# Patient Record
Sex: Male | Born: 1970 | Race: Asian | Hispanic: Yes | Marital: Single | Smoking: Current some day smoker
Health system: Southern US, Community
[De-identification: ages and names within clinical notes are randomized; demographics above are authoritative.]

## PROBLEM LIST (undated history)

## (undated) ENCOUNTER — Emergency Department (HOSPITAL_COMMUNITY): Admission: EM | Payer: Self-pay | Source: Home / Self Care

## (undated) DIAGNOSIS — Z8616 Personal history of COVID-19: Secondary | ICD-10-CM

## (undated) DIAGNOSIS — Z8709 Personal history of other diseases of the respiratory system: Secondary | ICD-10-CM

## (undated) DIAGNOSIS — Z8619 Personal history of other infectious and parasitic diseases: Secondary | ICD-10-CM

## (undated) DIAGNOSIS — Z9889 Other specified postprocedural states: Secondary | ICD-10-CM

## (undated) HISTORY — DX: Other specified postprocedural states: Z98.890

## (undated) HISTORY — DX: Personal history of COVID-19: Z86.16

## (undated) HISTORY — PX: OTHER SURGICAL HISTORY: SHX169

## (undated) HISTORY — DX: Personal history of other diseases of the respiratory system: Z87.09

---
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
## 2009-11-20 ENCOUNTER — Emergency Department (HOSPITAL_COMMUNITY): Admission: EM | Admit: 2009-11-20 | Discharge: 2009-11-20 | Payer: Self-pay | Admitting: Emergency Medicine

## 2019-05-24 ENCOUNTER — Ambulatory Visit (HOSPITAL_COMMUNITY)
Admission: EM | Admit: 2019-05-24 | Discharge: 2019-05-24 | Disposition: A | Discharge status: Home / Self Care | Payer: HRSA Program | Attending: Family Medicine | Admitting: Family Medicine

## 2019-05-24 ENCOUNTER — Encounter (HOSPITAL_COMMUNITY): Payer: Self-pay | Admitting: *Deleted

## 2019-05-24 ENCOUNTER — Other Ambulatory Visit: Payer: Self-pay

## 2019-05-24 DIAGNOSIS — U071 COVID-19: Secondary | ICD-10-CM | POA: Diagnosis not present

## 2019-05-24 DIAGNOSIS — F17210 Nicotine dependence, cigarettes, uncomplicated: Secondary | ICD-10-CM | POA: Insufficient documentation

## 2019-05-24 DIAGNOSIS — Z79899 Other long term (current) drug therapy: Secondary | ICD-10-CM | POA: Diagnosis not present

## 2019-05-24 DIAGNOSIS — R509 Fever, unspecified: Secondary | ICD-10-CM | POA: Diagnosis present

## 2019-05-24 NOTE — ED Provider Notes (Signed)
[facility]    CSN: [id number] Arrival date & time: 05/24/19  1313      History   Chief Complaint Chief Complaint  Patient presents with  . Fever    HPI Michael Clay is a 48 y.o. male.   HPI  Patient presents today with a concern for fever. He reports that he is unable to measure his temperature at home as he does not have a thermometer.  Approximately 4 days ago upon returning home from work he felt feverish and has not returned to work since that day due to concern for possible fever.  He denies symptoms of diarrhea, abdominal pain, shortness of breath, headache or cough.  He reports since 2 days ago he has felt completely fine however with presented today to confirm that he was no longer experiencing any fever.  He has taken a couple doses of Tylenol but wanted to know if there was any other medications that he could take to treat symptoms of fever.  He is afebrile on arrival today. History reviewed. No pertinent past medical history.  There are no active problems to display for this patient.   History reviewed. No pertinent surgical history.     Home Medications    Prior to Admission medications   Medication Sig Start Date End Date Taking? Authorizing Provider  Acetaminophen (TYLENOL PO) Take by mouth.   Yes [provider]    Family History Family History  Problem Relation Age of Onset  . Healthy Mother   . Healthy Father     Social History Social History   Tobacco Use  . Smoking status: Current Some Day Smoker    Types: Cigarettes  . Smokeless tobacco: Never Used  Substance Use Topics  . Alcohol use: Yes    Comment: occasionally  . Drug use: Never     Allergies   Patient has no known allergies.   Review of Systems Review of Systems Pertinent negatives listed in HPI Physical Exam Triage Vital Signs ED Triage Vitals  Enc Vitals Group     BP 05/24/19 1439 115/77     Pulse Rate 05/24/19 1439 82     Resp 05/24/19 1439 18    Temp 05/24/19 1440 97.9 F (36.6 C)     Temp src --      SpO2 05/24/19 1439 95 %     Weight --      Height --      Head Circumference --      Peak Flow --      Pain Score 05/24/19 1441 0     Pain Loc --      Pain Edu? --      Excl. in GC? --    No data found.  Updated Vital Signs BP 115/77   Pulse 82   Temp 97.9 F (36.6 C) Comment: Had Tylenol @ 1300  Resp 18   SpO2 95%   Visual Acuity Right Eye Distance:   Left Eye Distance:   Bilateral Distance:    Right Eye Near:   Left Eye Near:    Bilateral Near:     Physical Exam General appearance: alert, well developed, well nourished, cooperative and in no distress Head: Normocephalic, without obvious abnormality, atraumatic Respiratory: Respirations even and unlabored, normal respiratory rate Heart: rate and rhythm normal. No gallop or murmurs noted on exam  Abdomen: BS +, no distention, no rebound tenderness, or no mass Extremities: No gross deformities Skin: Skin color, texture, turgor normal. No  rashes seen  Psych: Appropriate mood and affect. Neurologic: Mental status: Alert, oriented to person, place, and time, thought content appropriate.  UC Treatments / Results  Labs (all labs ordered are listed, but only abnormal results are displayed) Labs Reviewed  NOVEL CORONAVIRUS, NAA (HOSPITAL ORDER, SEND-OUT TO REF LAB)    EKG  Radiology No results found.  Procedures Procedures (including critical care time)  Medications Ordered in UC Medications - No data to display  Initial Impression / Assessment and Plan / UC Course  I have reviewed the triage vital signs and the nursing notes.  Pertinent labs & imaging results that were available during my care of the patient were reviewed by me and considered in my medical decision making (see chart for details).   Mr. Michael Clay presents today afebrile with concern of fever.  Reassurance provided. Information provided in Spanish regarding signs and symptoms associated  with complicated fever and indication for taking Tylenol.  Patient was appreciative and verbalized understanding and was discharged.  COVID test was collected during today's encounter and is pending. Final Clinical Impressions(s) / UC Diagnoses   Final diagnoses:  Fever, unspecified   Discharge Instructions   None    ED Prescriptions    None     Controlled Substance Prescriptions [place] Controlled Substance Registry consulted? Not Applicable   Scot Jun, [place] 05/24/19 1539

## 2019-05-24 NOTE — ED Triage Notes (Signed)
Pt states he had fever starting approx 5 days ago, but works outside doing roofing.  Denies any other sxs; states fevers have been improving.

## 2019-05-28 LAB — NOVEL CORONAVIRUS, NAA (HOSP ORDER, SEND-OUT TO REF LAB; TAT 18-24 HRS): SARS-CoV-2, NAA: DETECTED — AB

## 2019-05-29 ENCOUNTER — Telehealth (HOSPITAL_COMMUNITY): Payer: Self-pay | Admitting: Emergency Medicine

## 2019-05-29 NOTE — Telephone Encounter (Signed)
Your test for COVID-19 was positive, meaning that you were infected with the novel coronavirus and could give the germ to others.  Please continue isolation at home, for at least 10 days since the start of your fever/cough/breathlessness and until you have had 3 consecutive days without fever (without taking a fever reducer) and with cough/breathlessness improving. Please continue good preventive care measures, including:  frequent hand-washing, avoid touching your face, cover coughs/sneezes, stay out of crowds and keep a 6 foot distance from others.  Recheck or go to the nearest hospital ED tent for re-assessment if fever/cough/breathlessness return.  Patient contacted and made aware of    results, all questions answered   

## 2019-06-04 ENCOUNTER — Telehealth (HOSPITAL_COMMUNITY): Payer: Self-pay | Admitting: Emergency Medicine

## 2019-06-04 MED ORDER — BENZONATATE 100 MG PO CAPS: 100.0000 mg | ORAL_CAPSULE | Freq: Three times a day (TID) | ORAL | 0 refills | Status: AC

## 2019-06-04 NOTE — Telephone Encounter (Signed)
Pt dx with covid 11 days ago, pt called stating he has a lingering cough that wont go away, okay to sent [place] per Lanelle Bal NP, will send to pt preferred pharmacy.

## 2019-06-05 ENCOUNTER — Inpatient Hospital Stay (HOSPITAL_COMMUNITY)
Admission: EM | Admit: 2019-06-05 | Discharge: 2019-06-17 | DRG: 207 | Disposition: A | Discharge status: Home / Self Care | Payer: HRSA Program | Attending: Internal Medicine | Admitting: Internal Medicine

## 2019-06-05 ENCOUNTER — Encounter (HOSPITAL_COMMUNITY): Payer: Self-pay | Admitting: Emergency Medicine

## 2019-06-05 ENCOUNTER — Telehealth (HOSPITAL_COMMUNITY): Payer: Self-pay | Admitting: Emergency Medicine

## 2019-06-05 ENCOUNTER — Other Ambulatory Visit: Payer: Self-pay

## 2019-06-05 ENCOUNTER — Emergency Department (HOSPITAL_COMMUNITY): Payer: HRSA Program

## 2019-06-05 DIAGNOSIS — K5900 Constipation, unspecified: Secondary | ICD-10-CM | POA: Diagnosis present

## 2019-06-05 DIAGNOSIS — J1289 Other viral pneumonia: Secondary | ICD-10-CM | POA: Diagnosis present

## 2019-06-05 DIAGNOSIS — J939 Pneumothorax, unspecified: Secondary | ICD-10-CM | POA: Diagnosis not present

## 2019-06-05 DIAGNOSIS — J189 Pneumonia, unspecified organism: Secondary | ICD-10-CM | POA: Diagnosis not present

## 2019-06-05 DIAGNOSIS — J9311 Primary spontaneous pneumothorax: Secondary | ICD-10-CM | POA: Diagnosis not present

## 2019-06-05 DIAGNOSIS — K7040 Alcoholic hepatic failure without coma: Secondary | ICD-10-CM | POA: Diagnosis present

## 2019-06-05 DIAGNOSIS — Z4682 Encounter for fitting and adjustment of non-vascular catheter: Secondary | ICD-10-CM | POA: Diagnosis not present

## 2019-06-05 DIAGNOSIS — J9601 Acute respiratory failure with hypoxia: Secondary | ICD-10-CM | POA: Diagnosis present

## 2019-06-05 DIAGNOSIS — J181 Lobar pneumonia, unspecified organism: Secondary | ICD-10-CM | POA: Diagnosis not present

## 2019-06-05 DIAGNOSIS — F17210 Nicotine dependence, cigarettes, uncomplicated: Secondary | ICD-10-CM | POA: Diagnosis present

## 2019-06-05 DIAGNOSIS — J930 Spontaneous tension pneumothorax: Secondary | ICD-10-CM | POA: Diagnosis not present

## 2019-06-05 DIAGNOSIS — Z9689 Presence of other specified functional implants: Secondary | ICD-10-CM | POA: Diagnosis not present

## 2019-06-05 DIAGNOSIS — U071 COVID-19: Secondary | ICD-10-CM | POA: Diagnosis present

## 2019-06-05 DIAGNOSIS — J1282 Pneumonia due to coronavirus disease 2019: Secondary | ICD-10-CM | POA: Diagnosis present

## 2019-06-05 DIAGNOSIS — R579 Shock, unspecified: Secondary | ICD-10-CM | POA: Diagnosis not present

## 2019-06-05 DIAGNOSIS — J988 Other specified respiratory disorders: Secondary | ICD-10-CM | POA: Diagnosis not present

## 2019-06-05 DIAGNOSIS — Z01818 Encounter for other preprocedural examination: Secondary | ICD-10-CM

## 2019-06-05 DIAGNOSIS — R0902 Hypoxemia: Secondary | ICD-10-CM

## 2019-06-05 DIAGNOSIS — Z9289 Personal history of other medical treatment: Secondary | ICD-10-CM | POA: Diagnosis not present

## 2019-06-05 DIAGNOSIS — J80 Acute respiratory distress syndrome: Secondary | ICD-10-CM | POA: Diagnosis present

## 2019-06-05 DIAGNOSIS — E875 Hyperkalemia: Secondary | ICD-10-CM | POA: Diagnosis present

## 2019-06-05 DIAGNOSIS — E876 Hypokalemia: Secondary | ICD-10-CM | POA: Diagnosis not present

## 2019-06-05 DIAGNOSIS — L8990 Pressure ulcer of unspecified site, unspecified stage: Secondary | ICD-10-CM | POA: Insufficient documentation

## 2019-06-05 DIAGNOSIS — F1010 Alcohol abuse, uncomplicated: Secondary | ICD-10-CM | POA: Diagnosis present

## 2019-06-05 DIAGNOSIS — J9690 Respiratory failure, unspecified, unspecified whether with hypoxia or hypercapnia: Secondary | ICD-10-CM

## 2019-06-05 DIAGNOSIS — R739 Hyperglycemia, unspecified: Secondary | ICD-10-CM | POA: Diagnosis present

## 2019-06-05 DIAGNOSIS — R0602 Shortness of breath: Secondary | ICD-10-CM

## 2019-06-05 DIAGNOSIS — T859XXA Unspecified complication of internal prosthetic device, implant and graft, initial encounter: Secondary | ICD-10-CM

## 2019-06-05 DIAGNOSIS — J9600 Acute respiratory failure, unspecified whether with hypoxia or hypercapnia: Secondary | ICD-10-CM

## 2019-06-05 DIAGNOSIS — Z452 Encounter for adjustment and management of vascular access device: Secondary | ICD-10-CM

## 2019-06-05 LAB — COMPREHENSIVE METABOLIC PANEL
ALT: 50 U/L — ABNORMAL HIGH (ref 0–44)
AST: 85 U/L — ABNORMAL HIGH (ref 15–41)
Albumin: 1.9 g/dL — ABNORMAL LOW (ref 3.5–5.0)
Alkaline Phosphatase: 114 U/L (ref 38–126)
Anion gap: 11 (ref 5–15)
BUN: 11 mg/dL (ref 6–20)
CO2: 18 mmol/L — ABNORMAL LOW (ref 22–32)
Calcium: 7.4 mg/dL — ABNORMAL LOW (ref 8.9–10.3)
Chloride: 102 mmol/L (ref 98–111)
Creatinine, Ser: 0.84 mg/dL (ref 0.61–1.24)
GFR calc Af Amer: >60 mL/min (ref >60)
GFR calc non Af Amer: >60 mL/min (ref >60)
Glucose, Bld: 104 mg/dL — ABNORMAL HIGH (ref 70–99)
Potassium: 4.8 mmol/L (ref 3.5–5.1)
Sodium: 131 mmol/L — ABNORMAL LOW (ref 135–145)
Total Bilirubin: 1.1 mg/dL (ref 0.3–1.2)
Total Protein: 5.8 g/dL — ABNORMAL LOW (ref 6.5–8.1)

## 2019-06-05 LAB — URINALYSIS, ROUTINE W REFLEX MICROSCOPIC
Bilirubin Urine: NEGATIVE
Glucose, UA: NEGATIVE mg/dL
Hgb urine dipstick: NEGATIVE
Ketones, ur: 5 mg/dL — AB
Leukocytes,Ua: NEGATIVE
Nitrite: NEGATIVE
Protein, ur: NEGATIVE mg/dL
Specific Gravity, Urine: 1.011 (ref 1.005–1.030)
pH: 6 (ref 5.0–8.0)

## 2019-06-05 LAB — LACTIC ACID, PLASMA
Lactic Acid, Venous: 1.6 mmol/L (ref 0.5–1.9)
Lactic Acid, Venous: 1.5 mmol/L (ref 0.5–1.9)

## 2019-06-05 LAB — PROTIME-INR
INR: 1.3 — ABNORMAL HIGH (ref 0.8–1.2)
Prothrombin Time: 16.3 seconds — ABNORMAL HIGH (ref 11.4–15.2)

## 2019-06-05 LAB — CBC WITH DIFFERENTIAL/PLATELET
Abs Immature Granulocytes: 0.38 10*3/uL — ABNORMAL HIGH (ref 0.00–0.07)
Basophils Absolute: 0.1 10*3/uL (ref 0.0–0.1)
Basophils Relative: 1 %
Eosinophils Absolute: 0.1 10*3/uL (ref 0.0–0.5)
Eosinophils Relative: 1 %
HCT: 44.9 % (ref 39.0–52.0)
Hemoglobin: 15 g/dL (ref 13.0–17.0)
Immature Granulocytes: 3 %
Lymphocytes Relative: 8 %
Lymphs Abs: 1 10*3/uL (ref 0.7–4.0)
MCH: 33 pg (ref 26.0–34.0)
MCHC: 33.4 g/dL (ref 30.0–36.0)
MCV: 98.9 fL (ref 80.0–100.0)
Monocytes Absolute: 1 10*3/uL (ref 0.1–1.0)
Monocytes Relative: 8 %
Neutro Abs: 9.9 10*3/uL — ABNORMAL HIGH (ref 1.7–7.7)
Neutrophils Relative %: 79 %
Platelets: 248 10*3/uL (ref 150–400)
RBC: 4.54 MIL/uL (ref 4.22–5.81)
RDW: 13.2 % (ref 11.5–15.5)
WBC: 12.4 10*3/uL — ABNORMAL HIGH (ref 4.0–10.5)
nRBC: 0.6 % — ABNORMAL HIGH (ref 0.0–0.2)

## 2019-06-05 LAB — POCT I-STAT 7, (LYTES, BLD GAS, ICA,H+H)
Acid-base deficit: 3 mmol/L — ABNORMAL HIGH (ref 0.0–2.0)
Bicarbonate: 20.5 mmol/L (ref 20.0–28.0)
Calcium, Ion: 1.13 mmol/L — ABNORMAL LOW (ref 1.15–1.40)
HCT: 43 % (ref 39.0–52.0)
Hemoglobin: 14.6 g/dL (ref 13.0–17.0)
O2 Saturation: 89 %
Patient temperature: 99.5
Potassium: 4.8 mmol/L (ref 3.5–5.1)
Sodium: 134 mmol/L — ABNORMAL LOW (ref 135–145)
TCO2: 21 mmol/L — ABNORMAL LOW (ref 22–32)
pCO2 arterial: 31.6 mmHg — ABNORMAL LOW (ref 32.0–48.0)
pH, Arterial: 7.422 (ref 7.350–7.450)
pO2, Arterial: 57 mmHg — ABNORMAL LOW (ref 83.0–108.0)

## 2019-06-05 LAB — TROPONIN I (HIGH SENSITIVITY)
Troponin I (High Sensitivity): 108 ng/L (ref <18)
Troponin I (High Sensitivity): 110 ng/L (ref <18)

## 2019-06-05 LAB — POCT I-STAT EG7
Acid-base deficit: 2 mmol/L (ref 0.0–2.0)
Bicarbonate: 21 mmol/L (ref 20.0–28.0)
Calcium, Ion: 1.02 mmol/L — ABNORMAL LOW (ref 1.15–1.40)
HCT: 45 % (ref 39.0–52.0)
Hemoglobin: 15.3 g/dL (ref 13.0–17.0)
O2 Saturation: 88 %
Potassium: 4.6 mmol/L (ref 3.5–5.1)
Sodium: 134 mmol/L — ABNORMAL LOW (ref 135–145)
TCO2: 22 mmol/L (ref 22–32)
pCO2, Ven: 29.4 mmHg — ABNORMAL LOW (ref 44.0–60.0)
pH, Ven: 7.461 — ABNORMAL HIGH (ref 7.250–7.430)
pO2, Ven: 51 mmHg — ABNORMAL HIGH (ref 32.0–45.0)

## 2019-06-05 LAB — FIBRINOGEN: Fibrinogen: 517 mg/dL — ABNORMAL HIGH (ref 210–475)

## 2019-06-05 LAB — LACTATE DEHYDROGENASE: LDH: 390 U/L — ABNORMAL HIGH (ref 98–192)

## 2019-06-05 LAB — C-REACTIVE PROTEIN: CRP: 16.1 mg/dL — ABNORMAL HIGH (ref <1.0)

## 2019-06-05 LAB — FERRITIN: Ferritin: 292 ng/mL (ref 24–336)

## 2019-06-05 LAB — APTT: aPTT: 30 seconds (ref 24–36)

## 2019-06-05 LAB — BRAIN NATRIURETIC PEPTIDE: B Natriuretic Peptide: 380.9 pg/mL — ABNORMAL HIGH (ref 0.0–100.0)

## 2019-06-05 LAB — D-DIMER, QUANTITATIVE: D-Dimer, Quant: 4.42 ug/mL-FEU — ABNORMAL HIGH (ref 0.00–0.50)

## 2019-06-05 MED ORDER — SODIUM CHLORIDE 0.9% FLUSH: 3.0000 mL | INTRAVENOUS | Status: DC | PRN

## 2019-06-05 MED ORDER — ACETAMINOPHEN 325 MG PO TABS
650.0000 mg | ORAL_TABLET | Freq: Four times a day (QID) | ORAL | Status: DC | PRN
  Administered 2019-06-11 – 2019-06-12 (×3): 650 mg via ORAL
  Filled 2019-06-05 (×2): qty 2

## 2019-06-05 MED ORDER — OXYCODONE HCL 5 MG PO TABS
5.0000 mg | ORAL_TABLET | ORAL | Status: DC | PRN
  Administered 2019-06-08 (×2): 5 mg via ORAL
  Filled 2019-06-05 (×2): qty 1

## 2019-06-05 MED ORDER — BISACODYL 5 MG PO TBEC
5.0000 mg | DELAYED_RELEASE_TABLET | Freq: Every day | ORAL | Status: DC | PRN
  Administered 2019-06-08: 11:00:00 5 mg via ORAL
  Filled 2019-06-05: qty 1

## 2019-06-05 MED ORDER — DEXAMETHASONE SODIUM PHOSPHATE 10 MG/ML IJ SOLN
10.0000 mg | Freq: Once | INTRAMUSCULAR | Status: AC
  Administered 2019-06-07: 10 mg via INTRAVENOUS

## 2019-06-05 MED ORDER — SODIUM CHLORIDE 0.9 % IV SOLN
1.0000 g | INTRAVENOUS | Status: AC
  Administered 2019-06-06 – 2019-06-11 (×6): 1 g via INTRAVENOUS
  Filled 2019-06-05 (×2): qty 1
  Filled 2019-06-05 (×2): qty 10
  Filled 2019-06-05 (×2): qty 1

## 2019-06-05 MED ORDER — SODIUM CHLORIDE 0.9 % IV SOLN
500.0000 mg | Freq: Once | INTRAVENOUS | Status: AC
  Administered 2019-06-05: 15:00:00 500 mg via INTRAVENOUS
  Filled 2019-06-05: qty 500

## 2019-06-05 MED ORDER — SODIUM CHLORIDE 0.9 % IV SOLN
250.0000 mL | INTRAVENOUS | Status: DC | PRN
  Administered 2019-06-06 – 2019-06-07 (×3): 250 mL via INTRAVENOUS

## 2019-06-05 MED ORDER — POLYETHYLENE GLYCOL 3350 17 G PO PACK
17.0000 g | PACK | Freq: Every day | ORAL | Status: DC | PRN
  Administered 2019-06-09: 17 g via ORAL
  Filled 2019-06-05: qty 1

## 2019-06-05 MED ORDER — SODIUM CHLORIDE 0.9 % IV SOLN
100.0000 mg | INTRAVENOUS | Status: AC
  Administered 2019-06-06 – 2019-06-09 (×4): 100 mg via INTRAVENOUS
  Filled 2019-06-05 (×4): qty 20

## 2019-06-05 MED ORDER — ONDANSETRON HCL 4 MG/2ML IJ SOLN: 4.0000 mg | Freq: Four times a day (QID) | INTRAMUSCULAR | Status: DC | PRN

## 2019-06-05 MED ORDER — SODIUM CHLORIDE 0.9% FLUSH
3.0000 mL | Freq: Two times a day (BID) | INTRAVENOUS | Status: DC
  Administered 2019-06-05 – 2019-06-14 (×17): 3 mL via INTRAVENOUS

## 2019-06-05 MED ORDER — ONDANSETRON HCL 4 MG PO TABS: 4.0000 mg | ORAL_TABLET | Freq: Four times a day (QID) | ORAL | Status: DC | PRN

## 2019-06-05 MED ORDER — SODIUM CHLORIDE 0.9% FLUSH
3.0000 mL | Freq: Two times a day (BID) | INTRAVENOUS | Status: DC
  Administered 2019-06-05 – 2019-06-14 (×17): 3 mL via INTRAVENOUS

## 2019-06-05 MED ORDER — SODIUM CHLORIDE 0.9 % IV SOLN
1.0000 g | Freq: Once | INTRAVENOUS | Status: AC
  Administered 2019-06-05: 1 g via INTRAVENOUS
  Filled 2019-06-05: qty 10

## 2019-06-05 MED ORDER — ENOXAPARIN SODIUM 40 MG/0.4ML ~~LOC~~ SOLN: 40.0000 mg | SUBCUTANEOUS | Status: DC

## 2019-06-05 MED ORDER — SODIUM CHLORIDE 0.9 % IV SOLN
200.0000 mg | Freq: Once | INTRAVENOUS | Status: AC
  Administered 2019-06-05 (×2): 200 mg via INTRAVENOUS
  Filled 2019-06-05: qty 40

## 2019-06-05 MED ORDER — DOCUSATE SODIUM 100 MG PO CAPS
100.0000 mg | ORAL_CAPSULE | Freq: Two times a day (BID) | ORAL | Status: DC
  Administered 2019-06-08: 11:00:00 100 mg via ORAL
  Filled 2019-06-05 (×2): qty 1

## 2019-06-05 MED ORDER — ENOXAPARIN SODIUM 40 MG/0.4ML ~~LOC~~ SOLN
40.0000 mg | SUBCUTANEOUS | Status: DC
  Administered 2019-06-05: 22:00:00 40 mg via SUBCUTANEOUS
  Filled 2019-06-05 (×2): qty 0.4

## 2019-06-05 MED ORDER — SODIUM CHLORIDE 0.9 % IV SOLN
500.0000 mg | INTRAVENOUS | Status: AC
  Administered 2019-06-06 – 2019-06-09 (×4): 500 mg via INTRAVENOUS
  Filled 2019-06-05 (×4): qty 500

## 2019-06-05 NOTE — ED Notes (Signed)
Floor unable to take report at this time; # provided.

## 2019-06-05 NOTE — ED Notes (Signed)
Positioned pt in prone; noted Sp02 improved to 94%

## 2019-06-05 NOTE — ED Notes (Signed)
Pt placed on 15 L [place] as well as 15 L nonrebreather.

## 2019-06-05 NOTE — ED Triage Notes (Addendum)
Pt tested positive for Covid Monday, called ems today for SOB, when EMS arrived pt was walking around the house and satting 66% on RA, pt came up to 91% on 15 L non-rebreather. Pt denies CP.

## 2019-06-05 NOTE — ED Notes (Signed)
Pt wife Verdis Frederickson [phone]

## 2019-06-05 NOTE — H&P (Signed)
History and Physical    Raynelle BringLeonel Ebel WJX:[id number] DOB: 10/04/1971 DOA: 06/05/2019  PCP: Patient, No Pcp Per Consultants:  None Patient coming from:  Home - lives with his partner and a roommate; NOK: [employer]  Chief Complaint: SOB  HPI: Raynelle BringLeonel Pursifull is a 48 y.o. male with no significant medical history presenting with worsening SOB in the setting of COVID.  He has not received care through Epic prior to an initial Urgent Care visit on 7/25.  At that time, he reported subjective fever starting 5 days prior (about 7/20).  He was afebrile at that visit and was tested for COVID.   He was notified on 7/30 that his COVID test was positive.  He called back yesterday with c/o persistent cough and was [place].  He called back today and reported SOB with O2 sats 69% at home.  EMS was called and sats were 66% with patient walking around his house, improved to 91% on 15L NRB. Denies significant cough.   ED Course: Diagnosed with COVID 3 days ago.  Worsening SOB x 2 days.  62% with EMS while ambulating. Difficulty getting sats up.  Placed on 15L NRB to low 90s, dropped to 80s.  Plan for high flow [place] [facility]  Placed on [place] O2 and also NRB and now finally with enough O2, improved WOB.  Troponin 100, CTA ordered to determine heparin or not.  May need PCCM consult.  Review of Systems: As per HPI; otherwise review of systems reviewed and negative.  Very limited by SOB/NRB as well as language barrier (despite tele-interpreter).    Ambulatory Status:  Ambulates without assistance  History reviewed. No pertinent past medical history.  History reviewed. No pertinent surgical history.  Social History   Socioeconomic History  . Marital status: Single    Spouse name: Not on file  . Number of children: Not on file  . Years of education: Not on file  . Highest education level: Not on file  Occupational History  . Not on file  Social Needs  . Financial resource strain: Not on file   . Food insecurity    Worry: Not on file    Inability: Not on file  . Transportation needs    Medical: Not on file    Non-medical: Not on file  Tobacco Use  . Smoking status: Current Some Day Smoker    Types: Cigarettes  . Smokeless tobacco: Never Used  Substance and Sexual Activity  . Alcohol use: Yes    Comment: drinks beer regularly  . Drug use: Never  . Sexual activity: Not on file  Lifestyle  . Physical activity    Days per week: Not on file    Minutes per session: Not on file  . Stress: Not on file  Relationships  . 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 Intimate partner violence    Fear of current or ex partner: Not on file    Emotionally abused: Not on file    Physically abused: Not on file    Forced sexual activity: Not on file  Other Topics Concern  . Not on file  Social History  Narrative  . Not on file    No Known Allergies  Family History  Problem Relation Age of Onset  . Healthy Mother   . Healthy Father     Prior to Admission medications   Medication Sig Start Date End Date Taking? Authorizing Provider  Acetaminophen (TYLENOL PO) Take by mouth.    [provider]  benzonatate (TESSALON) 100 MG capsule Take 1 capsule (100 mg total) by mouth every 8 (eight) hours. 06/04/19   Eustace MooreNelson, Yvonne Sue, MD    Physical Exam: Vitals:   06/05/19 1545 06/05/19 1600 06/05/19 1615 06/05/19 1630  BP: 127/80 123/86 112/79 126/72  Pulse: 98 93 95 97  Resp: (!) 37 (!) 39 (!) 39 (!) 33  Temp:      TempSrc:      SpO2: 91% (!) 88% (!) 89% (!) 87%     . General:  Appears very ill . Eyes:  PERRL, EOMI, normal lids, iris . ENT:  grossly normal hearing, lips & tongue, mildly dry mm . Neck:  no LAD, masses or thyromegaly .  Cardiovascular:  RRR, no m/r/g. No LE edema.  Marland Kitchen. Respiratory:   Diffuse rhonchi.  RR 30-40 consistently with O2 sats 88-90% on NRB with 10L [place] O2 . Abdomen:  soft, NT, ND, NABS . Back:   normal alignment, no CVAT . Skin:  no rash or induration seen on limited exam . Musculoskeletal:  grossly normal tone BUE/BLE, good ROM, no bony abnormality . Psychiatric: blunted mood and affect, speech fluent and appropriate, AOx3 . Neurologic:  CN 2-12 grossly intact, moves all extremities in coordinated fashion    Radiological Exams on Admission: Dg Chest Port 1 View  Result Date: 06/05/2019 CLINICAL DATA:  Shortness of breath. COVID-19. Hypoxia. EXAM: PORTABLE CHEST 1 VIEW COMPARISON:  None. FINDINGS: There are extensive bilateral peripheral hazy pulmonary infiltrates. No effusions. The heart size and pulmonary vascularity are normal. No acute bone abnormality. IMPRESSION: Extensive bilateral pulmonary infiltrates. Electronically Signed   By: Francene BoyersJames  Maxwell M.D.   On: 06/05/2019 13:36    EKG: Independently reviewed.  Sinus tachycardia with rate 104; nonspecific ST changes with no evidence of acute ischemia   Labs on Admission: I have personally reviewed the available labs and imaging studies at the time of the admission.  Pertinent labs:   Na++ 131 CO2 18 Glucose 104 Albumin 1.9 AST 85/ALT 50 HS troponin 108, 110 BNP 380.9 LDH 390 Ferritin 292 CRP 16.1 Fibrinogen 517 Lactate 1.6, 1.5 WBC 12.4 ABG: 7.461/29.4/51.0/88% INR 1.3 COVID POSITIVE UA: 5 ketones Blood cultures pending  Assessment/Plan Active Problems:   Respiratory tract infection due to COVID-19 virus   Acute respiratory failure with hypoxia -Patient with presenting with SOB and marked hypoxia with known COVID - he is critically ill -She does not have a usual home O2 requirement and is currently requiring 10L [place] O2 AND 15L NRB O2 -COVID POSITIVE -The patient has NO known comorbidities which may increase the risk for  ARDS/MODS -However, exam is concerning for development of ARDS/MODS due to respiratory distress -Pertinent labs concerning for COVID include increased LFTs; markedly elevated CRP (>7); elevated troponin; increased LDH; increased fibrinogen -CXR with "extensive bilateral infiltrates" -Will treat with broad-spectrum antibiotics  -[facility] for further evaluation, close monitoring, and treatment -At this time, will attempt to avoid use of aerosolized medications and use HFAs instead -Will check daily labs including BMP with Mag, Phos; LFTs; CBC with differential; CRP (q12h); ferritin; fibrinogen; D-dimer (q12h) -Consider  Echo (particularly if troponin is significantly elevated)  -Will order steroids (10 mg Decadron x 1) and Remdesivir (pharmacy consult) given +COVID test, +CXR, and hypoxia <94% on room air -PCCM consultation -If the patient is hypoxic or on >3L oxygen from baseline or CRP >7, considerTocilizumab 8 mg/kg x1 IF + COVID test; O2 sats <88% on room air; and patient is at high risk for intubation.  Will defer to [facility] doctors regarding this medication since it is being used off label.  They have been notified of patient transfer. -Will attempt to maintain euvolemia to a net negative fluid status -Will ask the patient to maintain an awake prone position for 16+ hours a day, if possible, with a minimum of 2-3 hours at a time -Given high-risk for ARDS/MODS, will use high-flow [place] up to 15L in a negative pressure room for now with PCCM consultation -Patient was seen wearing full PPE including: gown, gloves, head cover, N95, and face shield; donning and doffing was in compliance with current standards.    DVT prophylaxis:  Lovenox  Code Status:  Full  Family Communication: None present; I was unable to reach family Disposition Plan:  Home once clinically improved Consults called: PCCM Admission status: Admit - It is my clinical opinion that admission to INPATIENT is reasonable and  necessary because of the expectation that this patient will require hospital care that crosses at least 2 midnights to treat this condition based on the medical complexity of the problems presented.  Given the aforementioned information, the predictability of an adverse outcome is felt to be significant.    Total critical care time: 65 minutes Critical care time was exclusive of separately billable procedures and treating other patients. Critical care was necessary to treat or prevent imminent or life-threatening deterioration. Critical care was time spent personally by me on the following activities: development of treatment plan with patient and/or surrogate as well as nursing, discussions with consultants, evaluation of patient's response to treatment, examination of patient, obtaining history from patient or surrogate, ordering and performing treatments and interventions, ordering and review of laboratory studies, ordering and review of radiographic studies, pulse oximetry and re-evaluation of patient's condition.    Karmen Bongo MD [facility]   How to contact the [facility] Attending or Consulting provider [place] or covering provider during after hours [place], for this patient?  1. Check the care team in [facility] and look for a) attending/consulting [facility] provider listed and b) the [facility] team listed 2. Log into [email or web address] and use [place]'s universal password to access. If you do not have the password, please contact the hospital operator. 3. Locate the [facility] provider you are looking for under [facility] and page to a number that you can be directly reached. 4. If you still have difficulty reaching the provider, please page the [facility] (Director on Call) for the Hospitalists listed on amion for assistance.   06/05/2019, 5:52 PM

## 2019-06-05 NOTE — Progress Notes (Signed)
Patient sat on heated high flow is now at 91%, patient still resting comfortably in prone position.  Will continue to monitor.

## 2019-06-05 NOTE — Progress Notes (Signed)
Patient was brought to 2M11.  Sats were in 70s.  Prepared heated high flow [place] to put on patient.  Placed patient on 45L at 100% FIO2, patient sats now at 87%.  Patient is resting, will continue to monitor.

## 2019-06-05 NOTE — ED Notes (Signed)
Pt aware a urine sample is needed and will provide one when able.  

## 2019-06-05 NOTE — ED Provider Notes (Signed)
[facility] EMERGENCY DEPARTMENT Provider Note   CSN: [id number] Arrival date & time: 06/05/19  1239     History   Chief Complaint Chief Complaint  Patient presents with   Shortness of Breath    HPI Michael Clay is a 48 y.o. male.     The history is provided by the patient, the EMS personnel and medical records. No language interpreter was used.  Shortness of Breath Severity:  Severe Onset quality:  Gradual Duration:  3 days Timing:  Constant Progression:  Worsening Chronicity:  New Relieved by:  Nothing Worsened by:  Nothing Ineffective treatments:  None tried Associated symptoms: cough and fever   Associated symptoms: no abdominal pain, no chest pain, no headaches, no hemoptysis, no neck pain, no rash, no vomiting and no wheezing     No past medical history on file.  There are no active problems to display for this patient.   No past surgical history on file.      Home Medications    Prior to Admission medications   Medication Sig Start Date End Date Taking? Authorizing Provider  Acetaminophen (TYLENOL PO) Take by mouth.    [provider]  benzonatate (TESSALON) 100 MG capsule Take 1 capsule (100 mg total) by mouth every 8 (eight) hours. 06/04/19   Raylene Everts, MD    Family History Family History  Problem Relation Age of Onset   Healthy Mother    Healthy Father     Social History Social History   Tobacco Use   Smoking status: Current Some Day Smoker    Types: Cigarettes   Smokeless tobacco: Never Used  Substance Use Topics   Alcohol use: Yes    Comment: occasionally   Drug use: Never     Allergies   Patient has no known allergies.   Review of Systems Review of Systems  Constitutional: Positive for chills and fever. Negative for fatigue.  HENT: Negative for congestion.   Eyes: Negative for visual disturbance.  Respiratory: Positive for cough, chest tightness and shortness of breath. Negative for  hemoptysis, wheezing and stridor.   Cardiovascular: Negative for chest pain, palpitations and leg swelling.  Gastrointestinal: Negative for abdominal pain, constipation, diarrhea, nausea and vomiting.  Genitourinary: Negative for flank pain.  Musculoskeletal: Negative for back pain, neck pain and neck stiffness.  Skin: Negative for rash and wound.  Neurological: Negative for headaches.  Psychiatric/Behavioral: Negative for agitation.  All other systems reviewed and are negative.    Physical Exam Updated Vital Signs BP 134/83    Pulse 96    Temp (!) 101.3 F (38.5 C) (Oral)    Resp (!) 43    SpO2 (!) 88%   Physical Exam Vitals signs and nursing note reviewed.  Constitutional:      General: He is not in acute distress.    Appearance: He is well-developed. He is not ill-appearing, toxic-appearing or diaphoretic.  HENT:     Head: Normocephalic and atraumatic.  Eyes:     Conjunctiva/sclera: Conjunctivae normal.     Pupils: Pupils are equal, round, and reactive to light.  Neck:     Musculoskeletal: Neck supple.  Cardiovascular:     Rate and Rhythm: Regular rhythm. Tachycardia present.     Heart sounds: No murmur.  Pulmonary:     Effort: Tachypnea and accessory muscle usage present. No respiratory distress.     Breath sounds: No stridor. Rhonchi present.  Chest:     Chest wall: No tenderness.  Abdominal:     Palpations: Abdomen is soft.     Tenderness: There is no abdominal tenderness.  Musculoskeletal:     Right lower leg: He exhibits no tenderness. No edema.     Left lower leg: He exhibits no tenderness. No edema.  Skin:    General: Skin is warm and dry.  Neurological:     General: No focal deficit present.     Mental Status: He is alert.      ED Treatments / Results  Labs (all labs ordered are listed, but only abnormal results are displayed) Labs Reviewed  COMPREHENSIVE METABOLIC PANEL - Abnormal; Notable for the following components:      Result Value   Sodium  131 (*)    CO2 18 (*)    Glucose, Bld 104 (*)    Calcium 7.4 (*)    Total Protein 5.8 (*)    Albumin 1.9 (*)    AST 85 (*)    ALT 50 (*)    All other components within normal limits  CBC WITH DIFFERENTIAL/PLATELET - Abnormal; Notable for the following components:   WBC 12.4 (*)    nRBC 0.6 (*)    Neutro Abs 9.9 (*)    Abs Immature Granulocytes 0.38 (*)    All other components within normal limits  PROTIME-INR - Abnormal; Notable for the following components:   Prothrombin Time 16.3 (*)    INR 1.3 (*)    All other components within normal limits  POCT I-STAT EG7 - Abnormal; Notable for the following components:   pH, Ven 7.461 (*)    pCO2, Ven 29.4 (*)    pO2, Ven 51.0 (*)    Sodium 134 (*)    Calcium, Ion 1.02 (*)    All other components within normal limits  TROPONIN I (HIGH SENSITIVITY) - Abnormal; Notable for the following components:   Troponin I (High Sensitivity) 108 (*)    All other components within normal limits  CULTURE, BLOOD (ROUTINE X 2)  CULTURE, BLOOD (ROUTINE X 2)  URINE CULTURE  LACTIC ACID, PLASMA  APTT  LACTIC ACID, PLASMA  URINALYSIS, ROUTINE W REFLEX MICROSCOPIC  I-STAT VENOUS BLOOD GAS, ED  TROPONIN I (HIGH SENSITIVITY)    EKG  EKG Interpretation  Date/Time:  Thursday June 05 2019 12:49:23 EDT Ventricular Rate:  104 PR Interval:    QRS Duration: 93 QT Interval:  346 QTC Calculation: 456 R Axis:   145 Text Interpretation:  Sinus tachycardia Atrial premature complex Right axis deviation Repol abnrm, probable ischemia, anterior lds No prior ECG for comparison. Concerning ST abnormality in lead V3.  S1Q3T3 attern.  No STEMI Reconfirmed by Theda Belfastegeler, Chris ([id number]) on 06/05/2019 1:30:46 PM    EKG Interpretation  Date/Time:  Thursday June 05 2019 12:49:23 EDT Ventricular Rate:  104 PR Interval:    QRS Duration: 93 QT Interval:  346 QTC Calculation: 456 R Axis:   145 Text Interpretation:  Sinus tachycardia Atrial premature complex Right  axis deviation Repol abnrm, probable ischemia, anterior lds No prior ECG for comparison. Concerning ST abnormality in lead V3.  S1Q3T3 attern.  No STEMI Reconfirmed by Theda Belfastegeler, Chris ([id number]) on 06/05/2019 1:30:46 PM   Radiology Dg Chest Port 1 View  Result Date: 06/05/2019 CLINICAL DATA:  Shortness of breath. COVID-19. Hypoxia. EXAM: PORTABLE CHEST 1 VIEW COMPARISON:  None. FINDINGS: There are extensive bilateral peripheral hazy pulmonary infiltrates. No effusions. The heart size and pulmonary vascularity are normal. No acute bone abnormality. IMPRESSION: Extensive bilateral pulmonary infiltrates.  Electronically Signed   By: Francene BoyersJames  Maxwell M.D.   On: 06/05/2019 13:36    Procedures Procedures (including critical care time)  Michael Clay was evaluated in Emergency Department on 06/05/2019 for the symptoms described in the history of present illness. He was evaluated in the context of the global COVID-19 pandemic, which necessitated consideration that the patient might be at risk for infection with the SARS-CoV-2 virus that causes COVID-19. Institutional protocols and algorithms that pertain to the evaluation of patients at risk for COVID-19 are in a state of rapid change based on information released by regulatory bodies including the CDC and federal and state organizations. These policies and algorithms were followed during the patient's care in the ED.   CRITICAL CARE Performed by: Canary Brimhristopher J Korina Tretter Total critical care time: 50 minutes Critical care time was exclusive of separately billable procedures and treating other patients. Critical care was necessary to treat or prevent imminent or life-threatening deterioration. Critical care was time spent personally by me on the following activities: development of treatment plan with patient and/or surrogate as well as nursing, discussions with consultants, evaluation of patient's response to treatment, examination of patient, obtaining history from  patient or surrogate, ordering and performing treatments and interventions, ordering and review of laboratory studies, ordering and review of radiographic studies, pulse oximetry and re-evaluation of patient's condition.   Medications Ordered in ED Medications  cefTRIAXone (ROCEPHIN) 1 g in sodium chloride 0.9 % 100 mL IVPB (0 g Intravenous Stopped 06/05/19 1503)  azithromycin (ZITHROMAX) 500 mg in sodium chloride 0.9 % 250 mL IVPB (0 mg Intravenous Stopped 06/05/19 1503)     Initial Impression / Assessment and Plan / ED Course  I have reviewed the triage vital signs and the nursing notes.  Pertinent labs & imaging results that were available during my care of the patient were reviewed by me and considered in my medical decision making (see chart for details).        Michael Clay is a 48 y.o. male with a past medical history significant for tobacco abuse and recent diagnosis of COVID-19 who presents for shortness of breath.  Patient was found ambulating but had oxygen saturations in the 60s on room air.  On 2 L nonrebreather, his oxygen saturation was in the low 90s and on 15 L nonrebreather it is in the mid 90s.  He denies any chest pain, palpitations, nausea, vomiting, or constipation.  He does report some mild diarrhea and cough.  He denies any chest pain.  Patient overall appears well and is slightly tachycardic and tachypneic.  He is febrile at 101.3.  As patient was diagnosed with coronavirus 4 days ago, we will not retest him at this time.  Patient exam significant for some rhonchi on bilateral breath sounds.  Chest and back nontender.  Abdomen nontender.  Patient moving all extremities and had normal gait.  Patient will have work-up including labs and chest x-ray to look for pneumonia however, patient will require admission for his new oxygen requirement.  Due to the high risk exposure of a nonrebreather at 15 L, he will be transitioned to a high flow nasal cannula.  For transfer versus,  he may need to transition back to high flow nasal cannula but will try to wean him down if possible.  Patient will require admission for his hypoxia with COVID-19.  1:33 PM EKG was performed and is concerning for S1/Q3/T3 pattern.  Given his profound hypoxia, I am concerned about pulmonary embolism as well.  We will get a CT PE study prior to admission.  Will await x-ray results and if pneumonia is discovered, will start antibiotics.  2:02 PM The nonrebreather is now not keeping the patient adequately oxygenated.  Spoke with respiratory therapy and will try moving the patient to a negative pressure room to try the warmed high flow nasal cannula to see if this can prevent need for intubation.  If patient does not improve, will proceed to intubation.  Respiratory therapy reports that they will be unable to do the heated high flow nasal cannula in the emergency department.  3:22 PM Patient's oxygen and work of breathing improved with the nasal cannula under the nonrebreather.  Patient's oxygen saturation is now 94 to 95% and his respiratory rate is in the 20s.  Patient will still get the PE study and have his troponin trended however is likely appropriate to call for admission at this time.  Patient will need admission for the hypoxia with COVID and pneumonia.   Final Clinical Impressions(s) / ED Diagnoses   Final diagnoses:  COVID-19  Hypoxia  Shortness of breath  Community acquired pneumonia, unspecified laterality    ED Discharge Orders    None      Clinical Impression: 1. COVID-19   2. Hypoxia   3. Shortness of breath   4. Community acquired pneumonia, unspecified laterality     Disposition: Admit  This note was prepared with assistance of Conservation officer, historic buildingsDragon voice recognition software. Occasional wrong-word or sound-a-like substitutions may have occurred due to the inherent limitations of voice recognition software.     Tiphanie Vo, Canary Brimhristopher J, MD 06/05/19 [phone]

## 2019-06-05 NOTE — ED Notes (Signed)
Spoke with family via phone : [phone] w/ interpreter. Wife Michael Clay stated no existing medical problems.

## 2019-06-05 NOTE — Progress Notes (Signed)
Pt admitted for COVID-19. He was on 15L O2 HFNC. CXR showed LRTI. ALT slightly elevated at 50. Scr<1. Pt is waiting for an ICU bed here at [facility]. We will send the loading dose of remdesivir there.   Remdesivir 200mg  IV x1 then 100mg  IV q24 x4 F/u with ALT  Onnie Boer, PharmD, BCIDP, AAHIVP, CPP Infectious Disease Pharmacist 06/05/2019 6:01 PM

## 2019-06-05 NOTE — ED Notes (Signed)
Pt in bed; remained in prone position; resting w/ eyes closed, arousable. Pt stated he is comfortable in that position, needs to urinate. Assisted pt to use the urinal and repositioned back to prone.

## 2019-06-05 NOTE — ED Notes (Signed)
Per MD Tegeler, O2 sats okay at 88% or higher.

## 2019-06-05 NOTE — ED Notes (Signed)
MD Tegeler notified that pt is satting lower 80's despite repositioning and being on 15 L nonrebreather.  Per MD Tegeler since patient does not look like he is in great distress we will watch him and give antibiotics before intubation is discussed.  This RN will continue to monitor.

## 2019-06-05 NOTE — Progress Notes (Signed)
Looks comfortable on NRFM but sats 84%  No beds at [facility] we will admit to [facility] Recommend self prone

## 2019-06-05 NOTE — ED Notes (Signed)
ED TO INPATIENT HANDOFF REPORT  ED Nurse Name and Phone #: William Hamburger, RN [phone]  S Name/Age/Gender Michael Clay 48 y.o. male Room/Bed: 024C/024C  Code Status   Code Status: Not on file  Home/SNF/Other Home Patient oriented to: situation Is this baseline? No   Triage Complete: Triage complete  Chief Complaint Covid  Triage Note Pt tested positive for Covid Monday, called ems today for SOB, when EMS arrived pt was walking around the house and satting 66% on RA, pt came up to 91% on 15 L non-rebreather. Pt denies CP.    Allergies No Known Allergies  Level of Care/Admitting Diagnosis ED Disposition    ED Disposition Condition Comment   Admit  The patient appears reasonably stabilized for admission considering the current resources, flow, and capabilities available in the ED at this time, and I doubt any other [facility] requiring further screening and/or treatment in the ED prior to admission is  present.       B Medical/Surgery History History reviewed. No pertinent past medical history. History reviewed. No pertinent surgical history.   A IV Location/Drains/Wounds Patient Lines/Drains/Airways Status   Active Line/Drains/Airways    Name:   Placement date:   Placement time:   Site:   Days:   Peripheral IV 06/05/19 Left Antecubital   06/05/19    1305    Antecubital   less than 1   Peripheral IV 06/05/19 Right Antecubital   06/05/19    1355    Antecubital   less than 1          Intake/Output Last 24 hours No intake or output data in the 24 hours ending 06/05/19 1719  Labs/Imaging Results for orders placed or performed during the hospital encounter of 06/05/19 (from the past 48 hour(s))  Lactic acid, plasma     Status: None   Collection Time: 06/05/19  1:06 PM  Result Value Ref Range   Lactic Acid, Venous 1.6 0.5 - 1.9 mmol/L    Comment: Performed at [facility], [address], [place], [place] [address]  Comprehensive metabolic panel     Status: Abnormal   Collection Time: 06/05/19  1:06 PM  Result Value Ref Range   Sodium 131 (L) 135 - 145 mmol/L   Potassium 4.8 3.5 - 5.1 mmol/L   Chloride 102 98 - 111 mmol/L   CO2 18 (L) 22 - 32 mmol/L   Glucose, Bld 104 (H) 70 - 99 mg/dL   BUN 11 6 - 20 mg/dL   Creatinine, Ser 0.84 0.61 - 1.24 mg/dL   Calcium 7.4 (L) 8.9 - 10.3 mg/dL   Total Protein 5.8 (L) 6.5 - 8.1 g/dL   Albumin 1.9 (L) 3.5 - 5.0 g/dL   AST 85 (H) 15 - 41 U/L   ALT 50 (H) 0 - 44 U/L   Alkaline Phosphatase 114 38 - 126 U/L   Total Bilirubin 1.1 0.3 - 1.2 mg/dL   GFR calc non Af Amer >60 >60 mL/min   GFR calc Af Amer >60 >60 mL/min   Anion gap 11 5 - 15    Comment: Performed at [facility], [place] [address], [place], [place] [address]  CBC WITH DIFFERENTIAL     Status: Abnormal   Collection Time: 06/05/19  1:06 PM  Result Value Ref Range   WBC 12.4 (H) 4.0 - 10.5 K/uL   RBC 4.54 4.22 - 5.81 MIL/uL   Hemoglobin 15.0 13.0 - 17.0 g/dL   HCT 44.9 39.0 -  52.0 %   MCV 98.9 80.0 - 100.0 fL   MCH 33.0 26.0 - 34.0 pg   MCHC 33.4 30.0 - 36.0 g/dL   RDW 16.113.2 09.611.5 - 04.515.5 %   Platelets 248 150 - 400 K/uL   nRBC 0.6 (H) 0.0 - 0.2 %   Neutrophils Relative % 79 %   Neutro Abs 9.9 (H) 1.7 - 7.7 K/uL   Lymphocytes Relative 8 %   Lymphs Abs 1.0 0.7 - 4.0 K/uL   Monocytes Relative 8 %   Monocytes Absolute 1.0 0.1 - 1.0 K/uL   Eosinophils Relative 1 %   Eosinophils Absolute 0.1 0.0 - 0.5 K/uL   Basophils Relative 1 %   Basophils Absolute 0.1 0.0 - 0.1 K/uL   Immature Granulocytes 3 %   Abs Immature Granulocytes 0.38 (H) 0.00 - 0.07 K/uL    Comment: Performed at [facility], [address] [place], [place] [address]  APTT     Status: None   Collection Time: 06/05/19  1:06 PM  Result Value Ref Range   aPTT 30 24 - 36 seconds    Comment: Performed at [facility], [address] [place], [place] [address]  Protime-INR     Status: Abnormal   Collection Time: 06/05/19  1:06 PM  Result Value Ref Range   Prothrombin  Time 16.3 (H) 11.4 - 15.2 seconds   INR 1.3 (H) 0.8 - 1.2    Comment: (NOTE) INR goal varies based on device and disease states. Performed at [facility], [address] [place], [place] [address]   Troponin I (High Sensitivity)     Status: Abnormal   Collection Time: 06/05/19  1:06 PM  Result Value Ref Range   Troponin I (High Sensitivity) 108 (HH) <18 ng/L    Comment: CRITICAL RESULT CALLED TO, READ BACK BY AND VERIFIED WITH: CARTER,K RN @1426  ON [id number] BY FLEMINGS (NOTE) Elevated high sensitivity troponin I (hsTnI) values and significant  changes across serial measurements may suggest ACS but many other  chronic and acute conditions are known to elevate hsTnI results.  Refer to the Links section for chest pain algorithms and additional  guidance. Performed at [facility], [address] [place], [place] [address]   POCT I-Stat EG7     Status: Abnormal   Collection Time: 06/05/19  1:16 PM  Result Value Ref Range   pH, Ven 7.461 (H) 7.250 - 7.430   pCO2, Ven 29.4 (L) 44.0 - 60.0 mmHg   pO2, Ven 51.0 (H) 32.0 - 45.0 mmHg   Bicarbonate 21.0 20.0 - 28.0 mmol/L   TCO2 22 22 - 32 mmol/L   O2 Saturation 88.0 %   Acid-base deficit 2.0 0.0 - 2.0 mmol/L   Sodium 134 (L) 135 - 145 mmol/L   Potassium 4.6 3.5 - 5.1 mmol/L   Calcium, Ion 1.02 (L) 1.15 - 1.40 mmol/L   HCT 45.0 39.0 - 52.0 %   Hemoglobin 15.3 13.0 - 17.0 g/dL   Patient temperature HIDE    Sample type VENOUS   Urinalysis, Routine w reflex microscopic     Status: Abnormal   Collection Time: 06/05/19  2:59 PM  Result Value Ref Range   Color, Urine YELLOW YELLOW   APPearance CLEAR CLEAR   Specific Gravity, Urine 1.011 1.005 - 1.030   pH 6.0 5.0 - 8.0   Glucose, UA NEGATIVE NEGATIVE mg/dL   Hgb urine dipstick NEGATIVE NEGATIVE   Bilirubin Urine NEGATIVE  NEGATIVE   Ketones, ur 5 (A) NEGATIVE mg/dL   Protein, ur NEGATIVE NEGATIVE mg/dL   Nitrite NEGATIVE NEGATIVE   Leukocytes,Ua NEGATIVE NEGATIVE     Comment: Performed at [facility], [address] [place], [place] [address]  Lactic acid, plasma     Status: None   Collection Time: 06/05/19  3:00 PM  Result Value Ref Range   Lactic Acid, Venous 1.5 0.5 - 1.9 mmol/L    Comment: Performed at [facility], [address] [place], [place] [address]  Troponin I (High Sensitivity)     Status: Abnormal   Collection Time: 06/05/19  3:20 PM  Result Value Ref Range   Troponin I (High Sensitivity) 110 (HH) <18 ng/L    Comment: CRITICAL VALUE NOTED.  VALUE IS CONSISTENT WITH PREVIOUSLY REPORTED AND CALLED VALUE. (NOTE) Elevated high sensitivity troponin I (hsTnI) values and significant  changes across serial measurements may suggest ACS but many other  chronic and acute conditions are known to elevate hsTnI results.  Refer to the Links section for chest pain algorithms and additional  guidance. Performed at [facility], [address] [place], [place] [address]    Dg Chest Port 1 View  Result Date: 06/05/2019 CLINICAL DATA:  Shortness of breath. COVID-19. Hypoxia. EXAM: PORTABLE CHEST 1 VIEW COMPARISON:  None. FINDINGS: There are extensive bilateral peripheral hazy pulmonary infiltrates. No effusions. The heart size and pulmonary vascularity are normal. No acute bone abnormality. IMPRESSION: Extensive bilateral pulmonary infiltrates. Electronically Signed   By: Francene BoyersJames  Maxwell M.D.   On: 06/05/2019 13:36    Pending Labs Unresulted Labs (From admission, onward)    Start     Ordered   06/05/19 1635  HIV antibody (Routine Testing)  Once,   STAT     06/05/19 1635   06/05/19 1635  C-reactive protein  Once,   STAT     06/05/19 1635   06/05/19 1635  Brain natriuretic peptide  Once,   STAT     06/05/19 1635   06/05/19 1635  D-dimer, quantitative (not at [facility])  Once,   STAT     06/05/19 1635   06/05/19 1635  Ferritin  Once,   STAT     06/05/19 1635   06/05/19 1635  Fibrinogen  Once,   STAT     06/05/19 1635   06/05/19  1635  Interleukin-6, Plasma  Once,   STAT     06/05/19 1635   06/05/19 1635  Lactate dehydrogenase  Once,   STAT     06/05/19 1635   06/05/19 1635  Procalcitonin  Once,   STAT     06/05/19 1635   06/05/19 1300  Blood Culture (routine x 2)  BLOOD CULTURE X 2,   STAT     06/05/19 1300   06/05/19 1300  Urine culture  ONCE - STAT,   STAT     06/05/19 1300          Vitals/Pain Today's Vitals   06/05/19 1545 06/05/19 1600 06/05/19 1615 06/05/19 1630  BP: 127/80 123/86 112/79 126/72  Pulse: 98 93 95 97  Resp: (!) 37 (!) 39 (!) 39 (!) 33  Temp:      TempSrc:      SpO2: 91% (!) 88% (!) 89% (!) 87%  PainSc:        Isolation Precautions Airborne and Contact precautions  Medications Medications  cefTRIAXone (ROCEPHIN) 1 g in sodium chloride 0.9 % 100 mL IVPB (0  g Intravenous Stopped 06/05/19 1503)  azithromycin (ZITHROMAX) 500 mg in sodium chloride 0.9 % 250 mL IVPB (0 mg Intravenous Stopped 06/05/19 1503)    Mobility walks Low fall risk   Focused Assessments Pulmonary Assessment Handoff:  Lung sounds: Bilateral Breath Sounds: Coarse crackles, Diminished L Breath Sounds: Diminished, Coarse crackles R Breath Sounds: Diminished, Coarse crackles O2 Device: Nasal Cannula, NRB O2 Flow Rate (L/min): 15 L/min      R Recommendations: See Admitting Provider Note  Report given to:   Additional Notes: Currently on prone position; tachypnea, but relatively comfortable.

## 2019-06-05 NOTE — Consult Note (Signed)
NAME:  Michael Clay, MRN:  [id number], DOB:  10/04/1971, LOS: 0 ADMISSION DATE:  06/05/2019, CONSULTATION DATE:  06/05/2019 REFERRING MD:  Lorin Mercy - [facility] , CHIEF COMPLAINT:  SOB  Brief History   [name] COVID-19 +, exhibiting increasing oxygen requirements in ED. Now on NRB.   History of present illness   [name] known COVID-19 positive from community, no known significant medical history who presents to ED with worsening SOB x 2 days. EMS dispatched, patient SpO2 62%. Placed on 15L NRB with SpO2 recovery high 80s-low 90s.Troponin 100 in ED, CTA ordered by admitting hospitalist.  In ED, patient placed on [place] + NRB due to SpO2 in EDs. PCCM consulted to evaluate patient for possible intubation. Upon PCCM evaluation in ED, patient without respiratory distress and appears comfortable. We instruct patient to self-prone on current oxygen therapies and SpO2 recovered to 95%.   Past Medical History  No known [place] [facility] Events   8/6> admit to [facility] under hospitalist service   Consults:  PCCM   Procedures:    Significant Diagnostic Tests:  8/6 CXR> Diffuse bilateral pulmonary infiltrates   Micro Data:  7/25 SARS CoV2> positive  8/6 CTA   Antimicrobials:  8/6 Azithromycin 8/6 Rocephin   Interim history/subjective:  Comfortable appearing in ED Repositioned to prone position.   Objective   Blood pressure 126/72, pulse 97, temperature (!) 101.3 F (38.5 C), temperature source Oral, resp. rate (!) 33, SpO2 (!) 87 %.       No intake or output data in the 24 hours ending 06/05/19 1653 There were no vitals filed for this visit.  Examination: General: WDWN adult Male, prone on stretcher, mild tachypnea  HENT: NCAT. Trachea midline. Media and NRB appropriately positioned Lungs: Symmetrical chest expansion, shallow respirations, mildly elevated RR  Cardiovascular: RRR Abdomen: Soft, round Extremities: Symmetrical bulk and tone, no obvious deformity Neuro: Following commands  GU:  deferred   Resolved Hospital Problem list     Assessment & Plan:  Acute hypoxic resp failure:   -Secondary to SARS2 infection -pt on HF [place] and NRB, appears comfortable with mild tachypnea -titrate oxygen as able -SARS2 treatment protocol -recommend self prone, sats increased to 94% when done.   SARS2 infection +:  -transfer to [facility] -airborne isolation -as above  Building surveyor:  Diet: npo Pain/Anxiety/Delirium protocol (if indicated): not indicated VAP protocol (if indicated): not indicated DVT prophylaxis: heparin sq GI prophylaxis: not indicated Glucose control: ssi Mobility: bed, self prone Code Status: FULL Family Communication:  Disposition:  [facility] ICU  Labs   CBC: Recent Labs  Lab 06/05/19 1306 06/05/19 1316  WBC 12.4*  --   NEUTROABS 9.9*  --   HGB 15.0 15.3  HCT 44.9 45.0  MCV 98.9  --   PLT 248  --     Basic Metabolic Panel: Recent Labs  Lab 06/05/19 1306 06/05/19 1316  NA 131* 134*  K 4.8 4.6  CL 102  --   CO2 18*  --   GLUCOSE 104*  --   BUN 11  --   CREATININE 0.84  --   CALCIUM 7.4*  --    GFR: CrCl cannot be calculated (Unknown ideal weight.). Recent Labs  Lab 06/05/19 1306 06/05/19 1500  WBC 12.4*  --   LATICACIDVEN 1.6 1.5    Liver Function Tests: Recent Labs  Lab 06/05/19 1306  AST 85*  ALT 50*  ALKPHOS 114  BILITOT 1.1  PROT 5.8*  ALBUMIN  1.9*   No results for input(s): LIPASE, AMYLASE in the last 168 hours. No results for input(s): AMMONIA in the last 168 hours.  ABG    Component Value Date/Time   HCO3 21.0 06/05/2019 1316   TCO2 22 06/05/2019 1316   ACIDBASEDEF 2.0 06/05/2019 1316   O2SAT 88.0 06/05/2019 1316     Coagulation Profile: Recent Labs  Lab 06/05/19 1306  INR 1.3*    Cardiac Enzymes: No results for input(s): CKTOTAL, CKMB, CKMBINDEX, TROPONINI in the last 168 hours.  HbA1C: No results found for: HGBA1C  CBG: No results for input(s): GLUCAP in the last 168 hours.  Review of Systems:    SOB, fever, chills  Past Medical History  He,  has no past medical history on file.   Surgical History   History reviewed. No pertinent surgical history.   Social History   reports that he has been smoking cigarettes. He has never used smokeless tobacco. He reports current alcohol use. He reports that he does not use drugs.   Family History   His family history includes Healthy in his father and mother.   Allergies No Known Allergies   Home Medications  Prior to Admission medications   Medication Sig Start Date End Date Taking? Authorizing Provider  Acetaminophen (TYLENOL PO) Take by mouth.    [provider]  benzonatate (TESSALON) 100 MG capsule Take 1 capsule (100 mg total) by mouth every 8 (eight) hours. 06/04/19   Eustace MooreNelson, Yvonne Sue, MD     Critical care time: The patient is critically ill with multiple organ systems failure and requires high complexity decision making for assessment and support, frequent evaluation and titration of therapies, application of advanced monitoring technologies and extensive interpretation of multiple databases.  Critical care time 35 mins. This represents my time independent of the NP's/PA's/med students/residents time taking care of the pt. This is excluding procedures.     Briant SitesJessica Ala Capri DO Pager: [phone] After hours pager: [phone]   Pulmonary and Critical Care 06/05/2019, 5:01 PM

## 2019-06-05 NOTE — ED Notes (Addendum)
Respiratory at bedside.

## 2019-06-05 NOTE — Telephone Encounter (Signed)
Patient called stating he was feeling short of breath. Pt had some sort of device he put  "on his finger". With interpreter, pt stated that one of the numbers was 69, the other was 106. Pt was unable to explain the numbers. I asked the patient to take a deep breath and he said the number went up to 72, then he took another deep breath and it went up to 77. Possible oxygen saturation level, pt was dx with COVID 12 days ago. Pt instructed to go to the ER for evaluation to be sure his oxygen is not too low. Pt agreeable to plan, called Ezra Sites nurse at [facility] due to language barrier.

## 2019-06-05 NOTE — ED Notes (Signed)
Dr. Lorin Mercy aware of pending CT; unable to take pt d/t Sp02 level and position. MD aware of pts current status as well. Pt continues to rest in prone position; denies issue w/ position;

## 2019-06-06 DIAGNOSIS — U071 COVID-19: Secondary | ICD-10-CM | POA: Diagnosis not present

## 2019-06-06 DIAGNOSIS — J1289 Other viral pneumonia: Secondary | ICD-10-CM | POA: Diagnosis not present

## 2019-06-06 DIAGNOSIS — J988 Other specified respiratory disorders: Secondary | ICD-10-CM | POA: Diagnosis not present

## 2019-06-06 LAB — CBC WITH DIFFERENTIAL/PLATELET
Abs Immature Granulocytes: 0.23 10*3/uL — ABNORMAL HIGH (ref 0.00–0.07)
Basophils Absolute: 0 10*3/uL (ref 0.0–0.1)
Basophils Relative: 0 %
Eosinophils Absolute: 0.2 10*3/uL (ref 0.0–0.5)
Eosinophils Relative: 1 %
HCT: 45 % (ref 39.0–52.0)
Hemoglobin: 14.7 g/dL (ref 13.0–17.0)
Immature Granulocytes: 2 %
Lymphocytes Relative: 9 %
Lymphs Abs: 1.1 10*3/uL (ref 0.7–4.0)
MCH: 32.3 pg (ref 26.0–34.0)
MCHC: 32.7 g/dL (ref 30.0–36.0)
MCV: 98.9 fL (ref 80.0–100.0)
Monocytes Absolute: 1.1 10*3/uL — ABNORMAL HIGH (ref 0.1–1.0)
Monocytes Relative: 8 %
Neutro Abs: 10.3 10*3/uL — ABNORMAL HIGH (ref 1.7–7.7)
Neutrophils Relative %: 80 %
Platelets: 241 10*3/uL (ref 150–400)
RBC: 4.55 MIL/uL (ref 4.22–5.81)
RDW: 13.4 % (ref 11.5–15.5)
WBC: 12.9 10*3/uL — ABNORMAL HIGH (ref 4.0–10.5)
nRBC: 0.2 % (ref 0.0–0.2)

## 2019-06-06 LAB — POCT I-STAT 7, (LYTES, BLD GAS, ICA,H+H)
Acid-base deficit: 2 mmol/L (ref 0.0–2.0)
Bicarbonate: 21 mmol/L (ref 20.0–28.0)
Calcium, Ion: 1.16 mmol/L (ref 1.15–1.40)
HCT: 46 % (ref 39.0–52.0)
Hemoglobin: 15.6 g/dL (ref 13.0–17.0)
O2 Saturation: 83 %
Patient temperature: 99.1
Potassium: 4.5 mmol/L (ref 3.5–5.1)
Sodium: 136 mmol/L (ref 135–145)
TCO2: 22 mmol/L (ref 22–32)
pCO2 arterial: 32.2 mmHg (ref 32.0–48.0)
pH, Arterial: 7.424 (ref 7.350–7.450)
pO2, Arterial: 46 mmHg — ABNORMAL LOW (ref 83.0–108.0)

## 2019-06-06 LAB — RAPID URINE DRUG SCREEN, HOSP PERFORMED
Amphetamines: NOT DETECTED
Barbiturates: NOT DETECTED
Benzodiazepines: NOT DETECTED
Cocaine: NOT DETECTED
Opiates: NOT DETECTED
Tetrahydrocannabinol: NOT DETECTED

## 2019-06-06 LAB — COMPREHENSIVE METABOLIC PANEL
ALT: 49 U/L — ABNORMAL HIGH (ref 0–44)
AST: 83 U/L — ABNORMAL HIGH (ref 15–41)
Albumin: 1.8 g/dL — ABNORMAL LOW (ref 3.5–5.0)
Alkaline Phosphatase: 104 U/L (ref 38–126)
Anion gap: 10 (ref 5–15)
BUN: 12 mg/dL (ref 6–20)
CO2: 20 mmol/L — ABNORMAL LOW (ref 22–32)
Calcium: 7.8 mg/dL — ABNORMAL LOW (ref 8.9–10.3)
Chloride: 103 mmol/L (ref 98–111)
Creatinine, Ser: 0.7 mg/dL (ref 0.61–1.24)
GFR calc Af Amer: >60 mL/min (ref >60)
GFR calc non Af Amer: >60 mL/min (ref >60)
Glucose, Bld: 96 mg/dL (ref 70–99)
Potassium: 4.6 mmol/L (ref 3.5–5.1)
Sodium: 133 mmol/L — ABNORMAL LOW (ref 135–145)
Total Bilirubin: 1.6 mg/dL — ABNORMAL HIGH (ref 0.3–1.2)
Total Protein: 5.9 g/dL — ABNORMAL LOW (ref 6.5–8.1)

## 2019-06-06 LAB — GLUCOSE, CAPILLARY
Glucose-Capillary: 82 mg/dL (ref 70–99)
Glucose-Capillary: 138 mg/dL — ABNORMAL HIGH (ref 70–99)

## 2019-06-06 LAB — C-REACTIVE PROTEIN
CRP: 19.5 mg/dL — ABNORMAL HIGH (ref <1.0)
CRP: 21.4 mg/dL — ABNORMAL HIGH (ref <1.0)

## 2019-06-06 LAB — D-DIMER, QUANTITATIVE
D-Dimer, Quant: 3.75 ug/mL-FEU — ABNORMAL HIGH (ref 0.00–0.50)
D-Dimer, Quant: 3.41 ug/mL-FEU — ABNORMAL HIGH (ref 0.00–0.50)

## 2019-06-06 LAB — PHOSPHORUS: Phosphorus: 4.8 mg/dL — ABNORMAL HIGH (ref 2.5–4.6)

## 2019-06-06 LAB — URINE CULTURE: Culture: NO GROWTH

## 2019-06-06 LAB — FERRITIN: Ferritin: 324 ng/mL (ref 24–336)

## 2019-06-06 LAB — MAGNESIUM: Magnesium: 2.1 mg/dL (ref 1.7–2.4)

## 2019-06-06 MED ORDER — ENOXAPARIN SODIUM 40 MG/0.4ML ~~LOC~~ SOLN
40.0000 mg | Freq: Two times a day (BID) | SUBCUTANEOUS | Status: DC
  Administered 2019-06-06 (×2): 40 mg via SUBCUTANEOUS
  Filled 2019-06-06 (×2): qty 0.4

## 2019-06-06 MED ORDER — CHLORHEXIDINE GLUCONATE CLOTH 2 % EX PADS
6.0000 | MEDICATED_PAD | Freq: Every day | CUTANEOUS | Status: DC
  Administered 2019-06-06 – 2019-06-15 (×12): 6 via TOPICAL

## 2019-06-06 MED ORDER — ACETAMINOPHEN 650 MG RE SUPP
650.0000 mg | Freq: Four times a day (QID) | RECTAL | Status: DC | PRN
  Administered 2019-06-06: 650 mg via RECTAL
  Filled 2019-06-06: qty 1

## 2019-06-06 MED ORDER — DEXAMETHASONE SODIUM PHOSPHATE 10 MG/ML IJ SOLN
6.0000 mg | INTRAMUSCULAR | Status: DC
  Administered 2019-06-06 – 2019-06-14 (×8): 6 mg via INTRAVENOUS
  Filled 2019-06-06 (×2): qty 0.6
  Filled 2019-06-06 (×2): qty 1
  Filled 2019-06-06: qty 0.6
  Filled 2019-06-06 (×3): qty 1
  Filled 2019-06-06: qty 0.6

## 2019-06-06 MED ORDER — FUROSEMIDE 10 MG/ML IJ SOLN
40.0000 mg | Freq: Once | INTRAMUSCULAR | Status: AC
  Administered 2019-06-06: 09:00:00 40 mg via INTRAVENOUS
  Filled 2019-06-06: qty 4

## 2019-06-06 NOTE — Progress Notes (Signed)
Discussed pending CT and pt's current oxygenation status with Michael Clay, ACNP. RN instructed not to take pt for CT due to instability of SpO2 levels. Recent SpO2 levels were 82%, pt now 88% on 45L HFNC at 100% FiO2.

## 2019-06-06 NOTE — Progress Notes (Addendum)
NAME:  Michael BringLeonel Clay, MRN:  [id number], DOB:  10/04/1971, LOS: 1 ADMISSION DATE:  06/05/2019, CONSULTATION DATE:  06/05/2019 REFERRING MD:  Ophelia CharterYates - [facility] , CHIEF COMPLAINT:  SOB  Brief History   [name] COVID-19 +, exhibiting increasing oxygen requirements in ED. Now on NRB.   History of present illness   [name] known COVID-19 positive from community, no known significant medical history who presents to ED with worsening SOB x 2 days. EMS dispatched, patient SpO2 62%. Placed on 15L NRB with SpO2 recovery high 80s-low 90s.Troponin 100 in ED, CTA ordered by admitting hospitalist.  In ED, patient placed on [place] + NRB due to SpO2 in EDs. PCCM consulted to evaluate patient for possible intubation. Upon PCCM evaluation in ED, patient without respiratory distress and appears comfortable. We instruct patient to self-prone on current oxygen therapies and SpO2 recovered to 95%.   Past Medical History  No known PMH  Significant Hospital Events   [facility]   Consults:  PCCM   Procedures:    Significant Diagnostic Tests:  8/6 CXR> Diffuse bilateral pulmonary infiltrates   Micro Data:  7/25 SARS CoV2> positive  8/6 CTA on hold due to tenuous pulmonary status  Antimicrobials:  8/6 Azithromycin 8/6 Rocephin   Interim history/subjective:   Currently 100% FiO2 with sats of 86 to 89% Appear to be in acute distress able to carry on conversation Chest x-ray is ominous.  Objective   Blood pressure 103/73, pulse 82, temperature 99.6 F (37.6 C), temperature source Oral, resp. rate (!) 30, weight 70.6 kg, SpO2 (!) 88 %.    FiO2 (%):  [100 %] 100 %   Intake/Output Summary (Last 24 hours) at 06/06/2019 0754 Last data filed at 06/06/2019 0500 Gross per 24 hour  Intake -  Output 600 ml  Net -600 ml   Filed Weights   06/05/19 2200 06/06/19 0500  Weight: 71.8 kg 70.6 kg    Examination: General: Well-nourished well-developed male who does not speak [place] well  HEENT: Currently on 100% FiO2  Neuro:  CV: Heart sounds regular regular rate and rhythm PULM: Coarse rhonchi bilaterally currently 100% FiO2 with sats of 86 to 89% GI: soft, bsx4 active  Extremities: warm/dry, negative edema  Skin: no rashes or lesions   Resolved Hospital Problem list     Assessment & Plan:  Acute hypoxic resp failure: Secondary to SARS2 infection Supplemental oxygen.  Note currently on 100% oxygen satting 89% Intubation if needed COVID-19  treatment protocol with remdesivir and decadron and empirical abx Self prone position as tolerated 1 dose of Lasix 20 mg 06/06/2019    SARS2 infection +:  [facility] due to [facility]ing issues Airborne isolation Medical treatment as noted below  Best practice:  Diet: npo Pain/Anxiety/Delirium protocol (if indicated): not indicated VAP protocol (if indicated): not indicated DVT prophylaxis: heparin sq GI prophylaxis: not indicated Glucose control: ssi Mobility: bed, self prone Code Status: FULL Family Communication: 06/06/2019 patient updated at bedside Disposition:  [facility] ICU  Labs   CBC: Recent Labs  Lab 06/05/19 1306 06/05/19 1316  WBC 12.4*  --   NEUTROABS 9.9*  --   HGB 15.0 15.3  HCT 44.9 45.0  MCV 98.9  --   PLT 248  --     Basic Metabolic Panel: Recent Labs  Lab 06/05/19 1306 06/05/19 1316  NA 131* 134*  K 4.8 4.6  CL 102  --  CO2 18*  --   GLUCOSE 104*  --   BUN 11  --   CREATININE 0.84  --   CALCIUM 7.4*  --    GFR: CrCl cannot be calculated (Unknown ideal weight.). Recent Labs  Lab 06/05/19 1306 06/05/19 1500  WBC 12.4*  --   LATICACIDVEN 1.6 1.5    Liver Function Tests: Recent Labs  Lab 06/05/19 1306  AST 85*  ALT 50*  ALKPHOS 114  BILITOT 1.1  PROT 5.8*  ALBUMIN 1.9*   No results for input(s): LIPASE, AMYLASE in the last 168 hours. No results for input(s): AMMONIA in the last 168 hours.  ABG    Component Value Date/Time   HCO3 21.0  06/05/2019 1316   TCO2 22 06/05/2019 1316   ACIDBASEDEF 2.0 06/05/2019 1316   O2SAT 88.0 06/05/2019 1316     Coagulation Profile: Recent Labs  Lab 06/05/19 1306  INR 1.3*    Cardiac Enzymes: No results for input(s): CKTOTAL, CKMB, CKMBINDEX, TROPONINI in the last 168 hours.  HbA1C: No results found for: HGBA1C  CBG: No results for input(s): GLUCAP in the last 168 hours.   Critical care time: _Per APP 40 min     Richardson Landry Minor ACNP Maryanna Shape PCCM Pager [phone] till 1 pm If no answer page [phone] 06/06/2019, 7:54 AM    PCCM:  48 year old COVID-19 positive, COVID pneumonia.  Requiring high flow nasal cannula supplementation.  Currently self proning.   BP 100/66   Pulse 89   Temp 99.6 F (37.6 C) (Oral)   Resp (!) 34   Wt 70.6 kg   SpO2 (!) 89%   General: Resting in bed self proned Heart: Sinus on telemetry  Chest x-ray reviewed extensive bilateral pulmonary infiltrates.  Assessment: Acute hypoxemic respiratory failure secondary to COVID 19 pneumonia  Plan: Continue remdesivir  Start Decadron 6 mg IV every 24 x10 days Continue self proning  Could likely transfer on nonrebreather [facility] once bed situation is resolved Increased Lovenox DVT prophylaxis dosing  This patient is critically ill with multiple organ system failure; which, requires frequent high complexity decision making, assessment, support, evaluation, and titration of therapies. This was completed through the application of advanced monitoring technologies and extensive interpretation of multiple databases. During this encounter critical care time was devoted to patient care services described in this note for 32 minutes.   [facility] Critical [facility] 06/06/2019 11:03 AM

## 2019-06-07 ENCOUNTER — Inpatient Hospital Stay (HOSPITAL_COMMUNITY): Payer: HRSA Program

## 2019-06-07 DIAGNOSIS — J930 Spontaneous tension pneumothorax: Secondary | ICD-10-CM | POA: Diagnosis present

## 2019-06-07 DIAGNOSIS — U071 COVID-19: Secondary | ICD-10-CM | POA: Diagnosis not present

## 2019-06-07 DIAGNOSIS — J988 Other specified respiratory disorders: Secondary | ICD-10-CM | POA: Diagnosis not present

## 2019-06-07 DIAGNOSIS — J80 Acute respiratory distress syndrome: Secondary | ICD-10-CM | POA: Diagnosis not present

## 2019-06-07 DIAGNOSIS — J9311 Primary spontaneous pneumothorax: Secondary | ICD-10-CM | POA: Diagnosis not present

## 2019-06-07 LAB — COMPREHENSIVE METABOLIC PANEL
ALT: 49 U/L — ABNORMAL HIGH (ref 0–44)
AST: 59 U/L — ABNORMAL HIGH (ref 15–41)
Albumin: 2.4 g/dL — ABNORMAL LOW (ref 3.5–5.0)
Alkaline Phosphatase: 113 U/L (ref 38–126)
Anion gap: 14 (ref 5–15)
BUN: 27 mg/dL — ABNORMAL HIGH (ref 6–20)
CO2: 20 mmol/L — ABNORMAL LOW (ref 22–32)
Calcium: 8.4 mg/dL — ABNORMAL LOW (ref 8.9–10.3)
Chloride: 106 mmol/L (ref 98–111)
Creatinine, Ser: 0.79 mg/dL (ref 0.61–1.24)
GFR calc Af Amer: >60 mL/min (ref >60)
GFR calc non Af Amer: >60 mL/min (ref >60)
Glucose, Bld: 145 mg/dL — ABNORMAL HIGH (ref 70–99)
Potassium: 5.2 mmol/L — ABNORMAL HIGH (ref 3.5–5.1)
Sodium: 140 mmol/L (ref 135–145)
Total Bilirubin: 1.4 mg/dL — ABNORMAL HIGH (ref 0.3–1.2)
Total Protein: 7.7 g/dL (ref 6.5–8.1)

## 2019-06-07 LAB — CBC WITH DIFFERENTIAL/PLATELET
Abs Immature Granulocytes: 0.38 10*3/uL — ABNORMAL HIGH (ref 0.00–0.07)
Basophils Absolute: 0.1 10*3/uL (ref 0.0–0.1)
Basophils Relative: 0 %
Eosinophils Absolute: 0 10*3/uL (ref 0.0–0.5)
Eosinophils Relative: 0 %
HCT: 55.4 % — ABNORMAL HIGH (ref 39.0–52.0)
Hemoglobin: 17.7 g/dL — ABNORMAL HIGH (ref 13.0–17.0)
Immature Granulocytes: 2 %
Lymphocytes Relative: 13 %
Lymphs Abs: 2.9 10*3/uL (ref 0.7–4.0)
MCH: 32.6 pg (ref 26.0–34.0)
MCHC: 31.9 g/dL (ref 30.0–36.0)
MCV: 102 fL — ABNORMAL HIGH (ref 80.0–100.0)
Monocytes Absolute: 2.1 10*3/uL — ABNORMAL HIGH (ref 0.1–1.0)
Monocytes Relative: 10 %
Neutro Abs: 16.8 10*3/uL — ABNORMAL HIGH (ref 1.7–7.7)
Neutrophils Relative %: 75 %
Platelets: 422 10*3/uL — ABNORMAL HIGH (ref 150–400)
RBC: 5.43 MIL/uL (ref 4.22–5.81)
RDW: 14 % (ref 11.5–15.5)
WBC: 22.3 10*3/uL — ABNORMAL HIGH (ref 4.0–10.5)
nRBC: 0.1 % (ref 0.0–0.2)

## 2019-06-07 LAB — POCT I-STAT 7, (LYTES, BLD GAS, ICA,H+H)
Acid-base deficit: 8 mmol/L — ABNORMAL HIGH (ref 0.0–2.0)
Bicarbonate: 25.9 mmol/L (ref 20.0–28.0)
Calcium, Ion: 1.13 mmol/L — ABNORMAL LOW (ref 1.15–1.40)
HCT: 54 % — ABNORMAL HIGH (ref 39.0–52.0)
Hemoglobin: 18.4 g/dL — ABNORMAL HIGH (ref 13.0–17.0)
O2 Saturation: 84 %
Patient temperature: 97.6
Potassium: 5.7 mmol/L — ABNORMAL HIGH (ref 3.5–5.1)
Sodium: 141 mmol/L (ref 135–145)
TCO2: 29 mmol/L (ref 22–32)
pCO2 arterial: 87.5 mmHg (ref 32.0–48.0)
pH, Arterial: 7.075 — CL (ref 7.350–7.450)
pO2, Arterial: 68 mmHg — ABNORMAL LOW (ref 83.0–108.0)

## 2019-06-07 LAB — BLOOD GAS, ARTERIAL
Acid-base deficit: 3.6 mmol/L — ABNORMAL HIGH (ref 0.0–2.0)
Acid-base deficit: 6.4 mmol/L — ABNORMAL HIGH (ref 0.0–2.0)
Bicarbonate: 20.3 mmol/L (ref 20.0–28.0)
Bicarbonate: 23.2 mmol/L (ref 20.0–28.0)
Drawn by: 414221
Drawn by: 21338
FIO2: 100
FIO2: 100
MECHVT: 470 mL
O2 Content: 70 L/min
O2 Saturation: 71.8 %
O2 Saturation: 82.3 %
PEEP: 18 cmH2O
Patient temperature: 98.6
Patient temperature: 98.2
RATE: 20 resp/min
pCO2 arterial: 32.9 mmHg (ref 32.0–48.0)
pCO2 arterial: 89.8 mmHg (ref 32.0–48.0)
pH, Arterial: 7.407 (ref 7.350–7.450)
pH, Arterial: 7.039 — CL (ref 7.350–7.450)
pO2, Arterial: 42.4 mmHg — ABNORMAL LOW (ref 83.0–108.0)
pO2, Arterial: 74.1 mmHg — ABNORMAL LOW (ref 83.0–108.0)

## 2019-06-07 LAB — BASIC METABOLIC PANEL
Anion gap: 13 (ref 5–15)
BUN: 47 mg/dL — ABNORMAL HIGH (ref 6–20)
CO2: 21 mmol/L — ABNORMAL LOW (ref 22–32)
Calcium: 7.4 mg/dL — ABNORMAL LOW (ref 8.9–10.3)
Chloride: 104 mmol/L (ref 98–111)
Creatinine, Ser: 1.66 mg/dL — ABNORMAL HIGH (ref 0.61–1.24)
GFR calc Af Amer: 55 mL/min — ABNORMAL LOW (ref >60)
GFR calc non Af Amer: 48 mL/min — ABNORMAL LOW (ref >60)
Glucose, Bld: 300 mg/dL — ABNORMAL HIGH (ref 70–99)
Potassium: 6.4 mmol/L (ref 3.5–5.1)
Sodium: 138 mmol/L (ref 135–145)

## 2019-06-07 LAB — D-DIMER, QUANTITATIVE
D-Dimer, Quant: 13.82 ug/mL-FEU — ABNORMAL HIGH (ref 0.00–0.50)
D-Dimer, Quant: 5.44 ug/mL-FEU — ABNORMAL HIGH (ref 0.00–0.50)

## 2019-06-07 LAB — GLUCOSE, CAPILLARY
Glucose-Capillary: 274 mg/dL — ABNORMAL HIGH (ref 70–99)
Glucose-Capillary: 246 mg/dL — ABNORMAL HIGH (ref 70–99)

## 2019-06-07 LAB — C-REACTIVE PROTEIN
CRP: 19.4 mg/dL — ABNORMAL HIGH (ref <1.0)
CRP: 15.6 mg/dL — ABNORMAL HIGH (ref <1.0)

## 2019-06-07 LAB — FERRITIN: Ferritin: 513 ng/mL — ABNORMAL HIGH (ref 24–336)

## 2019-06-07 LAB — HIV ANTIBODY (ROUTINE TESTING W REFLEX): HIV Screen 4th Generation wRfx: NONREACTIVE

## 2019-06-07 LAB — MAGNESIUM: Magnesium: 2.6 mg/dL — ABNORMAL HIGH (ref 1.7–2.4)

## 2019-06-07 LAB — TRIGLYCERIDES: Triglycerides: 233 mg/dL — ABNORMAL HIGH (ref <150)

## 2019-06-07 LAB — PHOSPHORUS: Phosphorus: 6.6 mg/dL — ABNORMAL HIGH (ref 2.5–4.6)

## 2019-06-07 MED ORDER — VITAL 1.5 CAL PO LIQD
1000.0000 mL | ORAL | Status: DC
  Administered 2019-06-07 – 2019-06-12 (×3): 1000 mL
  Filled 2019-06-07 (×8): qty 1000

## 2019-06-07 MED ORDER — PROPOFOL 1000 MG/100ML IV EMUL
5.0000 ug/kg/min | INTRAVENOUS | Status: DC
  Administered 2019-06-07 (×3): 80 ug/kg/min via INTRAVENOUS
  Administered 2019-06-07: 15 ug/kg/min via INTRAVENOUS
  Administered 2019-06-07: 50 ug/kg/min via INTRAVENOUS
  Administered 2019-06-08 (×2): 80 ug/kg/min via INTRAVENOUS
  Administered 2019-06-08: 05:00:00 60 ug/kg/min via INTRAVENOUS
  Administered 2019-06-08 (×4): 80 ug/kg/min via INTRAVENOUS
  Administered 2019-06-09: 60 ug/kg/min via INTRAVENOUS
  Administered 2019-06-09: 70 ug/kg/min via INTRAVENOUS
  Administered 2019-06-09 – 2019-06-12 (×10): 25 ug/kg/min via INTRAVENOUS
  Administered 2019-06-13: 01:00:00 30 ug/kg/min via INTRAVENOUS
  Administered 2019-06-13: 20 ug/kg/min via INTRAVENOUS
  Filled 2019-06-07 (×4): qty 100
  Filled 2019-06-07: qty 200
  Filled 2019-06-07 (×5): qty 100
  Filled 2019-06-07: qty 200
  Filled 2019-06-07 (×14): qty 100

## 2019-06-07 MED ORDER — LIDOCAINE HCL (PF) 1 % IJ SOLN
INTRAMUSCULAR | Status: AC
  Filled 2019-06-07: qty 30

## 2019-06-07 MED ORDER — INSULIN ASPART 100 UNIT/ML IV SOLN
10.0000 [IU] | Freq: Once | INTRAVENOUS | Status: AC
  Administered 2019-06-07: 10 [IU] via INTRAVENOUS

## 2019-06-07 MED ORDER — ALBUTEROL SULFATE (2.5 MG/3ML) 0.083% IN NEBU
10.0000 mg | INHALATION_SOLUTION | Freq: Once | RESPIRATORY_TRACT | Status: AC
  Administered 2019-06-07: 10 mg via RESPIRATORY_TRACT
  Filled 2019-06-07: qty 12

## 2019-06-07 MED ORDER — SODIUM POLYSTYRENE SULFONATE 15 GM/60ML PO SUSP
30.0000 g | Freq: Once | ORAL | Status: AC
  Administered 2019-06-07: 30 g
  Filled 2019-06-07: qty 120

## 2019-06-07 MED ORDER — FENTANYL CITRATE (PF) 100 MCG/2ML IJ SOLN
100.0000 ug | INTRAMUSCULAR | Status: DC | PRN
  Administered 2019-06-08 – 2019-06-10 (×3): 100 ug via INTRAVENOUS
  Filled 2019-06-07 (×2): qty 2

## 2019-06-07 MED ORDER — PROPOFOL 1000 MG/100ML IV EMUL
INTRAVENOUS | Status: AC
  Filled 2019-06-07: qty 100

## 2019-06-07 MED ORDER — NOREPINEPHRINE 4 MG/250ML-% IV SOLN
0.0000 ug/min | INTRAVENOUS | Status: DC
  Administered 2019-06-07: 16 ug/min via INTRAVENOUS
  Administered 2019-06-07: 20 ug/min via INTRAVENOUS
  Administered 2019-06-07: 15 ug/min via INTRAVENOUS
  Administered 2019-06-08: 2 ug/min via INTRAVENOUS
  Filled 2019-06-07 (×6): qty 250

## 2019-06-07 MED ORDER — ROCURONIUM BROMIDE 10 MG/ML (PF) SYRINGE
PREFILLED_SYRINGE | INTRAVENOUS | Status: AC
  Administered 2019-06-07: 100 mg
  Filled 2019-06-07: qty 10

## 2019-06-07 MED ORDER — FENTANYL 2500MCG IN NS 250ML (10MCG/ML) PREMIX INFUSION
0.0000 ug/h | INTRAVENOUS | Status: DC
  Administered 2019-06-07: 400 ug/h via INTRAVENOUS
  Administered 2019-06-07: 08:00:00 50 ug/h via INTRAVENOUS
  Administered 2019-06-07 – 2019-06-08 (×4): 400 ug/h via INTRAVENOUS
  Administered 2019-06-09: 250 ug/h via INTRAVENOUS
  Administered 2019-06-09: 125 ug/h via INTRAVENOUS
  Administered 2019-06-10: 13:00:00 150 ug/h via INTRAVENOUS
  Administered 2019-06-11 – 2019-06-12 (×3): 175 ug/h via INTRAVENOUS
  Administered 2019-06-13: 125 ug/h via INTRAVENOUS
  Filled 2019-06-07 (×14): qty 250

## 2019-06-07 MED ORDER — FENTANYL CITRATE (PF) 100 MCG/2ML IJ SOLN
INTRAMUSCULAR | Status: AC
  Administered 2019-06-07: 50 ug via INTRAVENOUS
  Filled 2019-06-07: qty 2

## 2019-06-07 MED ORDER — IPRATROPIUM-ALBUTEROL 0.5-2.5 (3) MG/3ML IN SOLN
3.0000 mL | RESPIRATORY_TRACT | Status: DC
  Administered 2019-06-07: 3 mL via RESPIRATORY_TRACT
  Filled 2019-06-07: qty 3

## 2019-06-07 MED ORDER — MIDAZOLAM HCL 2 MG/2ML IJ SOLN
INTRAMUSCULAR | Status: AC
  Filled 2019-06-07: qty 2

## 2019-06-07 MED ORDER — NOREPINEPHRINE 4 MG/250ML-% IV SOLN
INTRAVENOUS | Status: AC
  Administered 2019-06-07: 5 ug/min
  Filled 2019-06-07: qty 250

## 2019-06-07 MED ORDER — ROCURONIUM BROMIDE 50 MG/5ML IV SOLN
100.0000 mg | Freq: Once | INTRAVENOUS | Status: AC
  Administered 2019-06-07: 100 mg via INTRAVENOUS
  Filled 2019-06-07: qty 10

## 2019-06-07 MED ORDER — VECURONIUM BROMIDE 10 MG IV SOLR
10.0000 mg | Freq: Once | INTRAVENOUS | Status: AC
  Administered 2019-06-07: 10 mg via INTRAVENOUS

## 2019-06-07 MED ORDER — CALCIUM GLUCONATE-NACL 1-0.675 GM/50ML-% IV SOLN
1.0000 g | Freq: Once | INTRAVENOUS | Status: AC
  Administered 2019-06-07: 1000 mg via INTRAVENOUS
  Filled 2019-06-07: qty 50

## 2019-06-07 MED ORDER — SODIUM CHLORIDE 0.9 % IV SOLN
INTRAVENOUS | Status: DC | PRN
  Administered 2019-06-07: 250 mL via INTRAVENOUS
  Administered 2019-06-08: 1000 mL via INTRAVENOUS
  Administered 2019-06-12 – 2019-06-13 (×2): 250 mL via INTRAVENOUS

## 2019-06-07 MED ORDER — FENTANYL CITRATE (PF) 100 MCG/2ML IJ SOLN
50.0000 ug | Freq: Once | INTRAMUSCULAR | Status: AC
  Administered 2019-06-07: 50 ug via INTRAVENOUS

## 2019-06-07 MED ORDER — PRO-STAT SUGAR FREE PO LIQD
30.0000 mL | Freq: Three times a day (TID) | ORAL | Status: DC
  Administered 2019-06-07 – 2019-06-14 (×20): 30 mL
  Filled 2019-06-07 (×20): qty 30

## 2019-06-07 MED ORDER — ORAL CARE MOUTH RINSE
15.0000 mL | OROMUCOSAL | Status: DC
  Administered 2019-06-07 – 2019-06-17 (×80): 15 mL via OROMUCOSAL

## 2019-06-07 MED ORDER — VECURONIUM BROMIDE 10 MG IV SOLR
INTRAVENOUS | Status: AC
  Filled 2019-06-07: qty 10

## 2019-06-07 MED ORDER — SODIUM BICARBONATE 8.4 % IV SOLN
INTRAVENOUS | Status: DC
  Administered 2019-06-07 – 2019-06-08 (×4): via INTRAVENOUS
  Filled 2019-06-07 (×4): qty 150

## 2019-06-07 MED ORDER — FENTANYL CITRATE (PF) 100 MCG/2ML IJ SOLN
50.0000 ug | Freq: Once | INTRAMUSCULAR | Status: AC
  Administered 2019-06-07: 08:00:00 50 ug via INTRAVENOUS

## 2019-06-07 MED ORDER — LACTATED RINGERS IV BOLUS
500.0000 mL | Freq: Once | INTRAVENOUS | Status: AC
  Administered 2019-06-07: 500 mL via INTRAVENOUS

## 2019-06-07 MED ORDER — ADULT MULTIVITAMIN LIQUID CH
15.0000 mL | Freq: Every day | ORAL | Status: DC
  Administered 2019-06-08 – 2019-06-13 (×6): 15 mL
  Filled 2019-06-07 (×6): qty 15

## 2019-06-07 MED ORDER — THIAMINE HCL 100 MG/ML IJ SOLN
100.0000 mg | Freq: Every day | INTRAMUSCULAR | Status: DC
  Administered 2019-06-07 – 2019-06-11 (×5): 100 mg via INTRAVENOUS
  Filled 2019-06-07 (×6): qty 2

## 2019-06-07 MED ORDER — ETOMIDATE 2 MG/ML IV SOLN
30.0000 mg | Freq: Once | INTRAVENOUS | Status: AC
  Administered 2019-06-07: 30 mg via INTRAVENOUS

## 2019-06-07 MED ORDER — IPRATROPIUM-ALBUTEROL 0.5-2.5 (3) MG/3ML IN SOLN
3.0000 mL | Freq: Three times a day (TID) | RESPIRATORY_TRACT | Status: DC
  Administered 2019-06-07 – 2019-06-13 (×18): 3 mL via RESPIRATORY_TRACT
  Filled 2019-06-07 (×18): qty 3

## 2019-06-07 MED ORDER — ENOXAPARIN SODIUM 80 MG/0.8ML ~~LOC~~ SOLN
1.0000 mg/kg | Freq: Two times a day (BID) | SUBCUTANEOUS | Status: DC
  Administered 2019-06-07 – 2019-06-11 (×9): 70 mg via SUBCUTANEOUS
  Filled 2019-06-07 (×2): qty 0.7
  Filled 2019-06-07: qty 0.8
  Filled 2019-06-07 (×3): qty 0.7
  Filled 2019-06-07 (×2): qty 0.8
  Filled 2019-06-07 (×2): qty 0.7

## 2019-06-07 MED ORDER — BENZONATATE 100 MG PO CAPS
200.0000 mg | ORAL_CAPSULE | Freq: Three times a day (TID) | ORAL | Status: DC | PRN
  Filled 2019-06-07: qty 2

## 2019-06-07 MED ORDER — FOLIC ACID 5 MG/ML IJ SOLN
1.0000 mg | Freq: Every day | INTRAMUSCULAR | Status: DC
  Administered 2019-06-07 – 2019-06-11 (×5): 1 mg via INTRAVENOUS
  Filled 2019-06-07 (×6): qty 0.2

## 2019-06-07 MED ORDER — CHLORHEXIDINE GLUCONATE 0.12% ORAL RINSE (MEDLINE KIT)
15.0000 mL | Freq: Two times a day (BID) | OROMUCOSAL | Status: DC
  Administered 2019-06-07 – 2019-06-14 (×15): 15 mL via OROMUCOSAL

## 2019-06-07 NOTE — Procedures (Signed)
Arterial Catheter Insertion Procedure Note Michael Clay [id number] 10/04/1971  Procedure: Insertion of Arterial Catheter  Indications: Blood pressure monitoring  Procedure Details Consent: Unable to obtain consent because of emergent medical necessity. Time Out: Verified patient identification, verified procedure, site/side was marked, verified correct patient position, special equipment/implants available, medications/allergies/relevent history reviewed, required imaging and test results available.  Performed  Maximum sterile technique was used including antiseptics. Skin prep: Chlorhexidine; local anesthetic administered 20 gauge catheter was inserted into right radial artery using the Seldinger technique. ULTRASOUND GUIDANCE USED: NO Evaluation Blood flow good; BP tracing good. Complications: No apparent complications.   Pierre Bali 06/07/2019

## 2019-06-07 NOTE — Procedures (Signed)
Chest Tube Insertion Procedure Note  Indications:  Clinically significant Pneumothorax  Pre-operative Diagnosis: Pneumothorax  Post-operative Diagnosis: Pneumothorax  Procedure Details  Informed consent was obtained for the procedure, including sedation.  Risks of lung perforation, hemorrhage, arrhythmia, and adverse drug reaction were discussed.   After sterile skin prep, using standard technique, a Wayne catheter was placed in the right lateral 4 rib space.  Findings: None  Estimated Blood Loss:  Minimal         Specimens:  None              Complications:  None; patient tolerated the procedure well.         Disposition: ICU - intubated and critically ill.         Condition: unstable  Attending Attestation: I performed the procedure.  Rush Farmer, M.D. [facility] Pulmonary/Critical Care Medicine. Pager: [phone]. After hours pager: [phone].

## 2019-06-07 NOTE — Procedures (Signed)
Central Venous Catheter Insertion Procedure Note [place] Michael Clay [id number] 10/04/1971  Procedure: Insertion of Central Venous Catheter Indications: Assessment of intravascular volume, Drug and/or fluid administration and Frequent blood sampling  Procedure Details Consent: Unable to obtain consent because of emergent medical necessity. Time Out: Verified patient identification, verified procedure, site/side was marked, verified correct patient position, special equipment/implants available, medications/allergies/relevent history reviewed, required imaging and test results available.  Performed  Maximum sterile technique was used including antiseptics, cap, gloves, gown, hand hygiene, mask and sheet. Skin prep: Chlorhexidine; local anesthetic administered A antimicrobial bonded/coated triple lumen catheter was placed in the right internal jugular vein using the Seldinger technique. Ultrasound guidance used.Yes.   Catheter placed to 17 cm. Blood aspirated via all 3 ports and then flushed x 3. Line sutured x 2 and dressing applied.  Evaluation Blood flow good Complications: No apparent complications Patient did tolerate procedure well. Chest X-ray ordered to verify placement.  CXR: pending.  Richardson Landry Minor ACNP Maryanna Shape PCCM Pager [phone] till 1 pm If no answer page [phone] 06/07/2019, 11:59 AM

## 2019-06-07 NOTE — Progress Notes (Addendum)
NAME:  Michael Clay, MRN:  [id number], DOB:  10/04/1971, LOS: 2 ADMISSION DATE:  06/05/2019, CONSULTATION DATE:  06/05/2019 REFERRING MD:  Lorin Mercy - [facility] , CHIEF COMPLAINT:  SOB  Brief History   [name] COVID-19 +, exhibiting increasing oxygen requirements in ED. Now on NRB.   History of present illness   [name] known COVID-19 positive from community, no known significant medical history who presents to ED with worsening SOB x 2 days. EMS dispatched, patient SpO2 62%. Placed on 15L NRB with SpO2 recovery high 80s-low 90s.Troponin 100 in ED, CTA ordered by admitting hospitalist.  In ED, patient placed on [place] + NRB due to SpO2 in EDs. PCCM consulted to evaluate patient for possible intubation. Upon PCCM evaluation in ED, patient without respiratory distress and appears comfortable. We instruct patient to self-prone on current oxygen therapies and SpO2 recovered to 95%.   Past Medical History  No known [place] [facility] Events   8/6> admit to [facility] under hospitalist service   Consults:  PCCM   Procedures:  06/07/2019 intubation 82,020 right chest tube insertion  Significant Diagnostic Tests:  8/6 CXR> Diffuse bilateral pulmonary infiltrates   Micro Data:  7/25 SARS CoV2> positive  8/6 CTA on hold due to tenuous pulmonary status 06/07/2019 sputum>>  Antimicrobials:  8/6 Azithromycin 8/6 Rocephin   Interim history/subjective:   Developed coughing spasm early a.m. 06/07/2019.  Acutely desaturations.  Quired urgent intubation found to have a right pneumothorax chest tube placed.  Remains acutely ill will attempt proneing.  Objective   Blood pressure 113/74, pulse (!) 103, temperature 100.2 F (37.9 C), temperature source Oral, resp. rate (!) 37, weight 68.3 kg, SpO2 (!) 76 %.    Vent Mode: PRVC FiO2 (%):  [100 %] 100 % Set Rate:  [18 bmp] 18 bmp Vt Set:  [470 mL] 470 mL PEEP:  [16 cmH20] 16 cmH20 Plateau Pressure:  [28 cmH20] 28 cmH20   Intake/Output Summary (Last 24 hours) at  06/07/2019 0854 Last data filed at 06/07/2019 0400 Gross per 24 hour  Intake 1844.54 ml  Output 2735 ml  Net -890.46 ml   Filed Weights   06/05/19 2200 06/06/19 0500 06/07/19 0438  Weight: 71.8 kg 70.6 kg 68.3 kg    Examination: General: Acutely ill male currently intubated on percent FiO2 with sats of 89 HEENT: Endotracheal tube gastric tube in place Neuro: Heavily sedated CV: Sounds are distant regular rate and rhythm PULM: Decreased air movement throughout.  Right chest tube to 20 cm suction in place Chest x-ray with endotracheal tube in appropriate position right chest tube in appropriate position bilateral airspace disease or GI: soft, bsx4 active  Extremities: warm/dry, negative edema  Skin: no rashes or lesions    Resolved Hospital Problem list     Assessment & Plan:  Acute hypoxic resp failure: Secondary to SARS2 infection COVID-19 positive test (U07.1, COVID-19) with Acute Respiratory Failure (J96.00, Acute respiratory failure) Vent dependent respiratory failure secondary to COVID-19 coupled with right pneumothorax intubated on 06/07/2019 acutely    Vent bundle Continue treatment COVID-19 with steroids, antimicrobial therapy, rimdesivir Bronchodilators Prone  Protocol Chest tube insertion on 06/07/2019 Chest tube to 20 cm suction Serial chest x-ray Serial ABGs   SARS2 infection +:  Transfer to [facility] on hold due to staffing issues Airborne isolation Medical treatment as noted below  Reported to be heavy drinker Started on folic acid thiamine On sedation protocol    Best practice:  Diet: npo  Pain/Anxiety/Delirium protocol (if indicated): not indicated VAP protocol (if indicated): not indicated DVT prophylaxis: heparin sq GI prophylaxis: not indicated Glucose control: ssi Mobility: bed, self prone Code Status: FULL Family Communication: 06/06/2019 patient updated at bedside Disposition:  [facility] ICU  Labs   CBC: Recent Labs  Lab 06/05/19  1306 06/05/19 1316  WBC 12.4*  --   NEUTROABS 9.9*  --   HGB 15.0 15.3  HCT 44.9 45.0  MCV 98.9  --   PLT 248  --     Basic Metabolic Panel: Recent Labs  Lab 06/05/19 1306 06/05/19 1316  NA 131* 134*  K 4.8 4.6  CL 102  --   CO2 18*  --   GLUCOSE 104*  --   BUN 11  --   CREATININE 0.84  --   CALCIUM 7.4*  --    GFR: CrCl cannot be calculated (Unknown ideal weight.). Recent Labs  Lab 06/05/19 1306 06/05/19 1500  WBC 12.4*  --   LATICACIDVEN 1.6 1.5    Liver Function Tests: Recent Labs  Lab 06/05/19 1306  AST 85*  ALT 50*  ALKPHOS 114  BILITOT 1.1  PROT 5.8*  ALBUMIN 1.9*   No results for input(s): LIPASE, AMYLASE in the last 168 hours. No results for input(s): AMMONIA in the last 168 hours.  ABG    Component Value Date/Time   HCO3 21.0 06/05/2019 1316   TCO2 22 06/05/2019 1316   ACIDBASEDEF 2.0 06/05/2019 1316   O2SAT 88.0 06/05/2019 1316     Coagulation Profile: Recent Labs  Lab 06/05/19 1306  INR 1.3*    Cardiac Enzymes: No results for input(s): CKTOTAL, CKMB, CKMBINDEX, TROPONINI in the last 168 hours.  HbA1C: No results found for: HGBA1C  CBG: No results for input(s): GLUCAP in the last 168 hours.   Critical care time: _Per APP 65 min     Brett CanalesSteve Minor ACNP [facility] P[facility]M Pager [phone] till 1 pm If no answer page [phone] 06/07/2019, 8:54 AM  Attending Note:  48 year old male with no known PMH who presents to PCCM with respiratory failure related to community acquired COVID-19 with ARDS.  Intubated overnight due to hypoxemic failure.  This AM with tension PTX on the right and progressive tachypnea and hypotension.  On exam, no BS on the right and coarse on the left.  I reviewed CXR myself, large PTX on the right.  Discussed with PCCM-NP.  Placed chest tube with CXR following showing near complete resolution.  Repeat ABG at 11 AM to determine need for vent adjustment as the last ABG was done with the PTX present.  If remains  hypoxemic will prone.  Lovenox 70 q12, decadron 6 daily and remdesivir ordered.  PCCM will continue to manage.  Awaiting a bed in [facility].  The patient is critically ill with multiple organ systems failure and requires high complexity decision making for assessment and support, frequent evaluation and titration of therapies, application of advanced monitoring technologies and extensive interpretation of multiple databases.   Critical Care Time devoted to patient care services described in this note is  35  Minutes. This time reflects time of care of this signee Dr Koren BoundWesam . This critical care time does not reflect procedure time, or teaching time or supervisory time of PA/NP/Med student/Med Resident etc but could involve care discussion time.  Alyson ReedyWesam G. , M.D. [facility] Pulmonary/Critical Care Medicine. Pager: [phone]. After hours pager: [phone].

## 2019-06-07 NOTE — Progress Notes (Addendum)
Initial Nutrition Assessment  DOCUMENTATION CODES:   Not applicable  INTERVENTION:   Vital 1.5 @ goal rate of 53ml/hr + Prostat 85ml TID via tube   Propofol: 12.3 ml/hr- provides 325kcal/day   Free water flushes 68ml q4 hours to maintain tube patency   Regimen provides 1885kcal/day, 102g/day protein 857ml/day free water   Provide Liquid MVI daily via tube   NUTRITION DIAGNOSIS:   Inadequate oral intake related to inability to eat(pt sedated and ventilated) as evidenced by NPO status.  GOAL:   Provide needs based on ASPEN/SCCM guidelines  MONITOR:   Vent status, Labs, Weight trends, TF tolerance, Skin, I & O's  REASON FOR ASSESSMENT:   Consult    ASSESSMENT:   48 year old with h/o etoh abuse admitted with COVID-19 positive, COVID pneumonia requiring intubation now s/p R chest tube placement secondary to pneumothorax.  RD working remotely.  Pt sedated an ventilated. OGT in place with side port at the GE junction; spoke to RN, tube has been advanced 5 cm and awaiting repeat radiograph. Pt is in prone position. Plan to initate tube feeds today. There is no weight history in chart to determine if any recent significant weight loss. There is also no documented height in chart.   Medications reviewed and include: dexamethasone, colace, lovenox, folic acid, thiamine, azithromycin, ceftriaxone, fentanyl, propofol    Labs reviewed: K 5.2(H), BUN 27(H), P 6.6(H), Mg 2.6(H) Triglycerides 233(H) Wbc- 22.3(H)  Patient is currently intubated on ventilator support MV: 9.4 L/min Temp (24hrs), Avg:99 F (37.2 C), Min:98.3 F (36.8 C), Max:100.2 F (37.9 C)  Propofol: 12.3 ml/hr- provides 325kcal/day   MAP- >71mmHg  UOP- 2749ml   Unable to complete Nutrition-Focused physical exam at this time.   Diet Order:   Diet Order            Diet NPO time specified  Diet effective now             EDUCATION NEEDS:   No education needs have been identified at this  time  Skin:  Skin Assessment: Reviewed RN Assessment  Last BM:  pta  Height:   Ht Readings from Last 1 Encounters:  No data found for Ht    Weight:   Wt Readings from Last 1 Encounters:  06/07/19 68.3 kg    Ideal Body Weight:     BMI:  There is no height or weight on file to calculate BMI.  Estimated Nutritional Needs:   Kcal:  1845kcal/day  Protein:  90-105g/day  Fluid:  >1.7L/day  Koleen Distance MS, RD, LDN Pager #- [phone] Office#- [phone] After Hours Pager: [phone]

## 2019-06-07 NOTE — Significant Event (Signed)
Patient had significant desaturation while on HFNC 85%/45L, down to 60% sats, and very slow to recover.  His sats did not reach above 76% after 1 hour.  Patient tachypneic, but still able to communicate.    After discussing case with [facility] physician Dr. Jimmy Footman and PCCM attending Dr. Lake Bells, decision was made to intubate for refractory hypoxia.   Intubation attempted 3x, with intermittent bagging, with success on third try.  Hemodynamically stable, except for tachycardia peri-intubation.    After intubation, patient still very hypoxic with sats in high 70s.    Plan: - CXR - sedation goal RASS -4 - plan for paralyzing and proning after CXR, OG, and foley placement - duonebs (wheezing heard) - continue remdesivir and decadron for covid treatment - would also therapeutically anticoagulate for possible PE in the setting of COVID hypercoagulability,  (lovenox increase to 1mg /kg BID - may have to start inhaled flolan if remains hypoxic due to covid ARDS

## 2019-06-07 NOTE — Progress Notes (Signed)
eLink Physician-Brief Progress Note Patient Name: Michael Clay DOB: 23-Apr-1971 MRN: [id number]   Date of Service  06/07/2019  HPI/Events of Note  Notified of glucose 274, K 6.4, creatinine 1.66  eICU Interventions  Hyperkalemia protocol ordered which includes insulin. Will see what the glucose trend is before ordering SSI     Intervention Category Major Interventions: Electrolyte abnormality - evaluation and management;Hyperglycemia - active titration of insulin therapy  Judd Lien 06/07/2019, 10:26 PM

## 2019-06-07 NOTE — Procedures (Signed)
Intubation Procedure Note Michael Clay [id number] 10/04/1971  Procedure: Intubation Indications: Respiratory insufficiency  Procedure Details Consent: Risks of procedure as well as the alternatives and risks of each were explained to the (patient/caregiver).  Consent for procedure obtained. Discussed with patient via translator and with patient's wife Time Out: Verified patient identification, verified procedure, site/side was marked, verified correct patient position, special equipment/implants available, medications/allergies/relevent history reviewed, required imaging and test results available.  Performed  Maximum sterile technique was used including antiseptics, cap, gloves, gown, hand hygiene and mask.  3    Evaluation Hemodynamic Status: BP stable throughout; O2 sats: transiently fell during during procedure Patient's Current Condition: stable Complications: No apparent complications Patient did tolerate procedure well. Chest X-ray ordered to verify placement.  CXR: pending.   Shellia Cleverly 06/07/2019

## 2019-06-08 ENCOUNTER — Inpatient Hospital Stay (HOSPITAL_COMMUNITY): Payer: HRSA Program

## 2019-06-08 DIAGNOSIS — R579 Shock, unspecified: Secondary | ICD-10-CM | POA: Diagnosis not present

## 2019-06-08 DIAGNOSIS — U071 COVID-19: Secondary | ICD-10-CM | POA: Diagnosis not present

## 2019-06-08 DIAGNOSIS — J9601 Acute respiratory failure with hypoxia: Secondary | ICD-10-CM | POA: Diagnosis not present

## 2019-06-08 DIAGNOSIS — J930 Spontaneous tension pneumothorax: Secondary | ICD-10-CM | POA: Diagnosis not present

## 2019-06-08 LAB — CBC WITH DIFFERENTIAL/PLATELET
Abs Immature Granulocytes: 0.27 10*3/uL — ABNORMAL HIGH (ref 0.00–0.07)
Basophils Absolute: 0 10*3/uL (ref 0.0–0.1)
Basophils Relative: 0 %
Eosinophils Absolute: 0 10*3/uL (ref 0.0–0.5)
Eosinophils Relative: 0 %
HCT: 48.6 % (ref 39.0–52.0)
Hemoglobin: 15.1 g/dL (ref 13.0–17.0)
Immature Granulocytes: 1 %
Lymphocytes Relative: 4 %
Lymphs Abs: 0.7 10*3/uL (ref 0.7–4.0)
MCH: 32.6 pg (ref 26.0–34.0)
MCHC: 31.1 g/dL (ref 30.0–36.0)
MCV: 105 fL — ABNORMAL HIGH (ref 80.0–100.0)
Monocytes Absolute: 1.7 10*3/uL — ABNORMAL HIGH (ref 0.1–1.0)
Monocytes Relative: 9 %
Neutro Abs: 16.3 10*3/uL — ABNORMAL HIGH (ref 1.7–7.7)
Neutrophils Relative %: 86 %
Platelets: 371 10*3/uL (ref 150–400)
RBC: 4.63 MIL/uL (ref 4.22–5.81)
RDW: 14 % (ref 11.5–15.5)
WBC: 19 10*3/uL — ABNORMAL HIGH (ref 4.0–10.5)
nRBC: 0 % (ref 0.0–0.2)

## 2019-06-08 LAB — POCT I-STAT 7, (LYTES, BLD GAS, ICA,H+H)
Acid-Base Excess: 4 mmol/L — ABNORMAL HIGH (ref 0.0–2.0)
Acid-Base Excess: 5 mmol/L — ABNORMAL HIGH (ref 0.0–2.0)
Acid-base deficit: 2 mmol/L (ref 0.0–2.0)
Bicarbonate: 24.6 mmol/L (ref 20.0–28.0)
Bicarbonate: 31.2 mmol/L — ABNORMAL HIGH (ref 20.0–28.0)
Bicarbonate: 31.4 mmol/L — ABNORMAL HIGH (ref 20.0–28.0)
Calcium, Ion: 1.12 mmol/L — ABNORMAL LOW (ref 1.15–1.40)
Calcium, Ion: 1.11 mmol/L — ABNORMAL LOW (ref 1.15–1.40)
Calcium, Ion: 1.06 mmol/L — ABNORMAL LOW (ref 1.15–1.40)
HCT: 47 % (ref 39.0–52.0)
HCT: 41 % (ref 39.0–52.0)
HCT: 37 % — ABNORMAL LOW (ref 39.0–52.0)
Hemoglobin: 16 g/dL (ref 13.0–17.0)
Hemoglobin: 13.9 g/dL (ref 13.0–17.0)
Hemoglobin: 12.6 g/dL — ABNORMAL LOW (ref 13.0–17.0)
O2 Saturation: 90 %
O2 Saturation: 91 %
O2 Saturation: 94 %
Patient temperature: 100
Patient temperature: 96.8
Patient temperature: 97.7
Potassium: 4.8 mmol/L (ref 3.5–5.1)
Potassium: 4 mmol/L (ref 3.5–5.1)
Potassium: 3.7 mmol/L (ref 3.5–5.1)
Sodium: 143 mmol/L (ref 135–145)
Sodium: 145 mmol/L (ref 135–145)
Sodium: 145 mmol/L (ref 135–145)
TCO2: 26 mmol/L (ref 22–32)
TCO2: 33 mmol/L — ABNORMAL HIGH (ref 22–32)
TCO2: 33 mmol/L — ABNORMAL HIGH (ref 22–32)
pCO2 arterial: 48.9 mmHg — ABNORMAL HIGH (ref 32.0–48.0)
pCO2 arterial: 55.9 mmHg — ABNORMAL HIGH (ref 32.0–48.0)
pCO2 arterial: 52.4 mmHg — ABNORMAL HIGH (ref 32.0–48.0)
pH, Arterial: 7.314 — ABNORMAL LOW (ref 7.350–7.450)
pH, Arterial: 7.35 (ref 7.350–7.450)
pH, Arterial: 7.382 (ref 7.350–7.450)
pO2, Arterial: 68 mmHg — ABNORMAL LOW (ref 83.0–108.0)
pO2, Arterial: 63 mmHg — ABNORMAL LOW (ref 83.0–108.0)
pO2, Arterial: 70 mmHg — ABNORMAL LOW (ref 83.0–108.0)

## 2019-06-08 LAB — HEMOGLOBIN A1C
Hgb A1c MFr Bld: 5.8 % — ABNORMAL HIGH (ref 4.8–5.6)
Mean Plasma Glucose: 119.76 mg/dL

## 2019-06-08 LAB — COMPREHENSIVE METABOLIC PANEL
ALT: 39 U/L (ref 0–44)
AST: 36 U/L (ref 15–41)
Albumin: 2 g/dL — ABNORMAL LOW (ref 3.5–5.0)
Alkaline Phosphatase: 83 U/L (ref 38–126)
Anion gap: 13 (ref 5–15)
BUN: 42 mg/dL — ABNORMAL HIGH (ref 6–20)
CO2: 22 mmol/L (ref 22–32)
Calcium: 7.7 mg/dL — ABNORMAL LOW (ref 8.9–10.3)
Chloride: 105 mmol/L (ref 98–111)
Creatinine, Ser: 1.28 mg/dL — ABNORMAL HIGH (ref 0.61–1.24)
GFR calc Af Amer: >60 mL/min (ref >60)
GFR calc non Af Amer: >60 mL/min (ref >60)
Glucose, Bld: 251 mg/dL — ABNORMAL HIGH (ref 70–99)
Potassium: 4.8 mmol/L (ref 3.5–5.1)
Sodium: 140 mmol/L (ref 135–145)
Total Bilirubin: 0.9 mg/dL (ref 0.3–1.2)
Total Protein: 5.9 g/dL — ABNORMAL LOW (ref 6.5–8.1)

## 2019-06-08 LAB — GLUCOSE, CAPILLARY
Glucose-Capillary: 204 mg/dL — ABNORMAL HIGH (ref 70–99)
Glucose-Capillary: 200 mg/dL — ABNORMAL HIGH (ref 70–99)
Glucose-Capillary: 99 mg/dL (ref 70–99)
Glucose-Capillary: 240 mg/dL — ABNORMAL HIGH (ref 70–99)
Glucose-Capillary: 217 mg/dL — ABNORMAL HIGH (ref 70–99)

## 2019-06-08 LAB — D-DIMER, QUANTITATIVE: D-Dimer, Quant: 2.49 ug/mL-FEU — ABNORMAL HIGH (ref 0.00–0.50)

## 2019-06-08 LAB — FERRITIN: Ferritin: 594 ng/mL — ABNORMAL HIGH (ref 24–336)

## 2019-06-08 LAB — NA AND K (SODIUM & POTASSIUM), RAND UR
Potassium Urine: 35 mmol/L
Sodium, Ur: 37 mmol/L

## 2019-06-08 LAB — POTASSIUM
Potassium: 5.7 mmol/L — ABNORMAL HIGH (ref 3.5–5.1)
Potassium: 4.7 mmol/L (ref 3.5–5.1)
Potassium: 4.1 mmol/L (ref 3.5–5.1)
Potassium: 5.4 mmol/L — ABNORMAL HIGH (ref 3.5–5.1)

## 2019-06-08 LAB — PHOSPHORUS: Phosphorus: 6 mg/dL — ABNORMAL HIGH (ref 2.5–4.6)

## 2019-06-08 LAB — C-REACTIVE PROTEIN: CRP: 12.1 mg/dL — ABNORMAL HIGH (ref <1.0)

## 2019-06-08 LAB — MAGNESIUM: Magnesium: 2.6 mg/dL — ABNORMAL HIGH (ref 1.7–2.4)

## 2019-06-08 MED ORDER — PANTOPRAZOLE SODIUM 40 MG IV SOLR
40.0000 mg | Freq: Every day | INTRAVENOUS | Status: DC
  Administered 2019-06-08 – 2019-06-11 (×4): 40 mg via INTRAVENOUS
  Filled 2019-06-08 (×5): qty 40

## 2019-06-08 MED ORDER — DOCUSATE SODIUM 50 MG/5ML PO LIQD
100.0000 mg | Freq: Two times a day (BID) | ORAL | Status: DC
  Administered 2019-06-08 – 2019-06-13 (×10): 100 mg
  Filled 2019-06-08 (×10): qty 10

## 2019-06-08 MED ORDER — INSULIN ASPART 100 UNIT/ML ~~LOC~~ SOLN
0.0000 [IU] | SUBCUTANEOUS | Status: DC
  Administered 2019-06-08 (×2): 3 [IU] via SUBCUTANEOUS
  Administered 2019-06-08 – 2019-06-09 (×4): 2 [IU] via SUBCUTANEOUS
  Administered 2019-06-10 – 2019-06-12 (×5): 1 [IU] via SUBCUTANEOUS
  Administered 2019-06-12: 2 [IU] via SUBCUTANEOUS
  Administered 2019-06-13 – 2019-06-14 (×3): 1 [IU] via SUBCUTANEOUS

## 2019-06-08 NOTE — Progress Notes (Signed)
Called with result of am chest xray.  Has recurrent large right sided PTX.  No bubbling in pleuravac.  Chest tube pulled back about 4 inches with return of bubbling in pleuravac, O2 sat increased from 94% to 99% after repositioning.  CXR pending.

## 2019-06-08 NOTE — Progress Notes (Signed)
Transport to [facility] on hold due to pt instability. Will transfer to [facility] when more stable.

## 2019-06-08 NOTE — Progress Notes (Addendum)
NAME:  Michael BringLeonel Clay, MRN:  [id number], DOB:  08-13-1971, LOS: 3 ADMISSION DATE:  06/05/2019, CONSULTATION DATE:  06/05/2019 REFERRING MD:  Ophelia CharterYates - [facility] , CHIEF COMPLAINT:  SOB  Brief History   [name] COVID-19 +, exhibiting increasing oxygen requirements in ED. Now on NRB.   History of present illness   [name] known COVID-19 positive from community, no known significant medical history who presents to ED with worsening SOB x 2 days. EMS dispatched, patient SpO2 62%. Placed on 15L NRB with SpO2 recovery high 80s-low 90s.Troponin 100 in ED, CTA ordered by admitting hospitalist.  In ED, patient placed on [place] + NRB due to SpO2 in EDs. PCCM consulted to evaluate patient for possible intubation. Upon PCCM evaluation in ED, patient without respiratory distress and appears comfortable. We instruct patient to self-prone on current oxygen therapies and SpO2 recovered to 95%.   Past Medical History  No known PMH  Significant Hospital Events   [facility]   Consults:  PCCM   Procedures:  06/07/2019 intubation 06/07/2019 right chest tube insertion 06/08/2019 chest tube repositioned  Significant Diagnostic Tests:  8/6 CXR> Diffuse bilateral pulmonary infiltrates   Micro Data:  7/25 SARS CoV2> positive  8/6 CTA on hold due to tenuous pulmonary status 06/07/2019 sputum>> 06/05/2019 urine culture negative 06/05/2019 blood cultures x2>> Antimicrobials:  8/6 Azithromycin 8/6 Rocephin   Interim history/subjective:   O2 saturations have improved with chest tube repositioning.  Currently in the prone position.  Long ABGs and evaluate when he may be stable to transfer [facility] when bed becomes available  Objective   Blood pressure (!) 84/63, pulse 91, temperature 100 F (37.8 C), temperature source Rectal, resp. rate (!) 35, weight 69.7 kg, SpO2 99 %.    Vent Mode: PRVC FiO2 (%):  [100 %] 100 % Set Rate:  [20 bmp-35 bmp] 35 bmp Vt Set:  [470 mL] 470 mL  PEEP:  [18 cmH20] 18 cmH20 Plateau Pressure:  [24 cmH20-30 cmH20] 24 cmH20   Intake/Output Summary (Last 24 hours) at 06/08/2019 0806 Last data filed at 06/08/2019 0700 Gross per 24 hour  Intake 4722.57 ml  Output 1250 ml  Net 3472.57 ml   Filed Weights   06/06/19 0500 06/07/19 0438 06/08/19 0500  Weight: 70.6 kg 68.3 kg 69.7 kg    Examination: General: Well-nourished well-developed Hispanic male currently in prone position HEENT: No JVD or lymphadenopathy is appreciated Neuro: Heavily sedated on mechanical ventilatory support CV: Heart sounds regular regular rate and rhythm PULM: Decreased breath sounds throughout.  Right chest tube has been repositioned and bubbling briskly with approximately 200 cc per tidal breaths volume loss.  Chest tube GI: soft, bsx4 active  Extremities: warm/dry, negative edema  Skin: no rashes or lesions     Resolved Hospital Problem list     Assessment & Plan:  Acute hypoxic resp failure: Secondary to SARS2 infection COVID-19 positive test (U07.1, COVID-19) with Acute Respiratory Failure (J96.00, Acute respiratory failure) Vent dependent respiratory failure secondary to COVID-19 coupled with right pneumothorax intubated on 06/07/2019 acutely    Wean FiO2 as tolerated Continue medical treatment as noted below Bronchodilators Prone protocol Chest tube was inserted on 06/07/2019 for right pneumothorax and repositioned on 06/08/2019 for recurrent pneumothorax Chest tube to 20 cm suction currently briskly bubbling Serial chest x-ray Serial ABG With FiO2 needs 100% and 18 of PEEP do not feel safe with transferring to [facility].  SARS2 infection +:  Transfer to [facility] hold due to severity of illness and reported lack of strength Airborne isolation Continue current [facility] with steroids and remdesivir    Reported to be heavy drinker Continue thiamine and folic acid Continue sedation protocol    Best practice:  Diet:  npo Pain/Anxiety/Delirium protocol (if indicated): not indicated VAP protocol (if indicated): not indicated DVT prophylaxis: Full-strength Lovenox GI prophylaxis: not indicated Glucose control: ssi Mobility: Bedrest with prone protocol Code Status: FULL Family Communication: 06/08/2019 no family at bedside Dis[facility]: [facility].  Once stabilized he may be able to [facility]   Labs   CBC: Recent Labs  Lab 06/05/19 1306 06/05/19 1316  WBC 12.4*  --   NEUTROABS 9.9*  --   HGB 15.0 15.3  HCT 44.9 45.0  MCV 98.9  --   PLT 248  --     Recent Labs  Lab 06/07/19 0706  06/07/19 2105 06/07/19 2208 06/07/19 2353 06/08/19 0213  NA 140   < > 138 131*  --  140  143  K 5.2*   < > 6.4* >8.5* 5.7* 4.8  4.8  CL 106  --  104  --   --  105  CO2 20*  --  21*  --   --  22  BUN 27*  --  47*  --   --  42*  CREATININE 0.79  --  1.66*  --   --  1.28*  GLUCOSE 145*  --  300*  --   --  251*   < > = values in this interval not displayed.   Recent Labs  Lab 06/06/19 0513  06/07/19 0706  06/07/19 1824 06/07/19 2208 06/08/19 0213  HGB 14.7   < > 17.7*   < > 16.3 16.7 15.1  16.0  HCT 45.0   < > 55.4*   < > 48.0 49.0 48.6  47.0  WBC 12.9*  --  22.3*  --   --   --  19.0*  PLT 241  --  422*  --   --   --  371   < > = values in this interval not displayed.     No results for input(s): LIPASE, AMYLASE in the last 168 hours. No results for input(s): AMMONIA in the last 168 hours.  ABG    Component Value Date/Time   PHART 7.314 (L) 06/08/2019 0213   PCO2ART 48.9 (H) 06/08/2019 0213   PO2ART 68.0 (L) 06/08/2019 0213   HCO3 24.6 06/08/2019 0213   TCO2 26 06/08/2019 0213   ACIDBASEDEF 2.0 06/08/2019 0213   O2SAT 90.0 06/08/2019 0213   Coagulation Profile: Lab Results  Component Value Date   INR 1.3 (H) 06/05/2019    Cardiac Enzymes: No results for input(s): CKTOTAL, CKMB, CKMBINDEX, TROPONINI in the last 168 hours.   HbA1C: No results found for: HGBA1C  CBG: CBG (last 3)  Recent Labs    06/07/19 2022 06/07/19 2311 06/08/19 0414  GLUCAP 274* 246* 204*      Critical care time: _Per APP 65 min     Brett CanalesSteve Minor ACNP [facility] P[facility]M Pager [phone] till 1 pm If no answer page [phone] 06/08/2019, 8:06 AM  Attending Note:  [age] year old male with COVID-19 with ARDS that is requiring high PEEP and FiO2 with PTX.  Overnight, PTX worsened and CT had to be readjusted.  On exam, sedate, now supine with coarse BS diffusely.  I  reviewed CXR myself, ETT and CT are in adequate positions at this point.  Discussed with PCCM-NP.  Will continue proning for 16 hours.  Continue steroids, lovenox and remdesivir.  Continue propofol for now.  D/C bicarb.  Continue rocephin and Zithromax.  Cx negative to date.  Will check procal, ?need for anti-bacterials.  Will hold in [facility] for today as I am concerned that moving him to [facility] on 100% and PEEP of 18 with a CT that need to be readjusted.  Will monitor here overnight.  The patient is critically ill with multiple organ systems failure and requires high complexity decision making for assessment and support, frequent evaluation and titration of therapies, application of advanced monitoring technologies and extensive interpretation of multiple databases.   Critical Care Time devoted to patient care services described in this note is  31  Minutes. This time reflects time of care of this signee Dr Jennet Maduro. This critical care time does not reflect procedure time, or teaching time or supervisory time of PA/NP/Med student/Med Resident etc but could involve care discussion time.  Rush Farmer, M.D. [facility] Pulmonary/Critical Care Medicine. Pager: [phone]. After hours pager: [phone].

## 2019-06-08 NOTE — Progress Notes (Signed)
[place] Progress Note Patient Name: Michael Clay DOB: Oct 10, 1971 MRN: [id number]   Date of Service  06/08/2019  HPI/Events of Note  Last ABG = 7.38/52.4/70.0 at 12 noon. Patient is on a NaHCO3 IV infusion at 125 mL/hour.  eICU Interventions  Will order: 1. Decrease NaHCO3 IV infusion rate to 50 mL/hour. 2. Repeat ABG at 5 AM.      Intervention Category Major Interventions: Acid-Base disturbance - evaluation and management  Ladana Chavero Eugene 06/08/2019, 11:11 PM

## 2019-06-09 ENCOUNTER — Inpatient Hospital Stay (HOSPITAL_COMMUNITY): Payer: HRSA Program

## 2019-06-09 DIAGNOSIS — U071 COVID-19: Secondary | ICD-10-CM | POA: Diagnosis not present

## 2019-06-09 DIAGNOSIS — J988 Other specified respiratory disorders: Secondary | ICD-10-CM | POA: Diagnosis not present

## 2019-06-09 DIAGNOSIS — J930 Spontaneous tension pneumothorax: Secondary | ICD-10-CM | POA: Diagnosis not present

## 2019-06-09 DIAGNOSIS — J80 Acute respiratory distress syndrome: Secondary | ICD-10-CM | POA: Diagnosis not present

## 2019-06-09 LAB — GLUCOSE, CAPILLARY
Glucose-Capillary: 108 mg/dL — ABNORMAL HIGH (ref 70–99)
Glucose-Capillary: 112 mg/dL — ABNORMAL HIGH (ref 70–99)
Glucose-Capillary: 157 mg/dL — ABNORMAL HIGH (ref 70–99)
Glucose-Capillary: 158 mg/dL — ABNORMAL HIGH (ref 70–99)
Glucose-Capillary: 172 mg/dL — ABNORMAL HIGH (ref 70–99)
Glucose-Capillary: 86 mg/dL (ref 70–99)
Glucose-Capillary: 94 mg/dL (ref 70–99)

## 2019-06-09 LAB — POCT I-STAT 7, (LYTES, BLD GAS, ICA,H+H)
Acid-base deficit: 10 mmol/L — ABNORMAL HIGH (ref 0.0–2.0)
Acid-base deficit: 5 mmol/L — ABNORMAL HIGH (ref 0.0–2.0)
Bicarbonate: 20.5 mmol/L (ref 20.0–28.0)
Bicarbonate: 23.8 mmol/L (ref 20.0–28.0)
Calcium, Ion: 0.94 mmol/L — ABNORMAL LOW (ref 1.15–1.40)
Calcium, Ion: 0.89 mmol/L — CL (ref 1.15–1.40)
HCT: 48 % (ref 39.0–52.0)
HCT: 49 % (ref 39.0–52.0)
Hemoglobin: 16.3 g/dL (ref 13.0–17.0)
Hemoglobin: 16.7 g/dL (ref 13.0–17.0)
O2 Saturation: 75 %
O2 Saturation: 85 %
Patient temperature: 97.6
Patient temperature: 99
Potassium: >8.5 mmol/L (ref 3.5–5.1)
Potassium: >8.5 mmol/L (ref 3.5–5.1)
Sodium: 137 mmol/L (ref 135–145)
Sodium: 131 mmol/L — ABNORMAL LOW (ref 135–145)
TCO2: 22 mmol/L (ref 22–32)
TCO2: 25 mmol/L (ref 22–32)
pCO2 arterial: 59.4 mmHg — ABNORMAL HIGH (ref 32.0–48.0)
pCO2 arterial: 58.6 mmHg — ABNORMAL HIGH (ref 32.0–48.0)
pH, Arterial: 7.142 — CL (ref 7.350–7.450)
pH, Arterial: 7.217 — ABNORMAL LOW (ref 7.350–7.450)
pO2, Arterial: 51 mmHg — ABNORMAL LOW (ref 83.0–108.0)
pO2, Arterial: 62 mmHg — ABNORMAL LOW (ref 83.0–108.0)

## 2019-06-09 LAB — BLOOD GAS, ARTERIAL
Acid-Base Excess: 10.9 mmol/L — ABNORMAL HIGH (ref 0.0–2.0)
Bicarbonate: 35 mmol/L — ABNORMAL HIGH (ref 20.0–28.0)
FIO2: 90
MECHVT: 470 mL
O2 Saturation: 96.4 %
PEEP: 18 cmH2O
Patient temperature: 97.5
RATE: 35 resp/min
pCO2 arterial: 45 mmHg (ref 32.0–48.0)
pH, Arterial: 7.499 — ABNORMAL HIGH (ref 7.350–7.450)
pO2, Arterial: 79.3 mmHg — ABNORMAL LOW (ref 83.0–108.0)

## 2019-06-09 LAB — COMPREHENSIVE METABOLIC PANEL
ALT: 32 U/L (ref 0–44)
AST: 46 U/L — ABNORMAL HIGH (ref 15–41)
Albumin: 1.6 g/dL — ABNORMAL LOW (ref 3.5–5.0)
Alkaline Phosphatase: 59 U/L (ref 38–126)
Anion gap: 9 (ref 5–15)
BUN: 19 mg/dL (ref 6–20)
CO2: 32 mmol/L (ref 22–32)
Calcium: 7.2 mg/dL — ABNORMAL LOW (ref 8.9–10.3)
Chloride: 105 mmol/L (ref 98–111)
Creatinine, Ser: 0.64 mg/dL (ref 0.61–1.24)
GFR calc Af Amer: >60 mL/min (ref >60)
GFR calc non Af Amer: >60 mL/min (ref >60)
Glucose, Bld: 175 mg/dL — ABNORMAL HIGH (ref 70–99)
Potassium: 3.6 mmol/L (ref 3.5–5.1)
Sodium: 146 mmol/L — ABNORMAL HIGH (ref 135–145)
Total Bilirubin: 0.5 mg/dL (ref 0.3–1.2)
Total Protein: 4.7 g/dL — ABNORMAL LOW (ref 6.5–8.1)

## 2019-06-09 LAB — CBC WITH DIFFERENTIAL/PLATELET
Abs Immature Granulocytes: 0.07 10*3/uL (ref 0.00–0.07)
Basophils Absolute: 0 10*3/uL (ref 0.0–0.1)
Basophils Relative: 0 %
Eosinophils Absolute: 0 10*3/uL (ref 0.0–0.5)
Eosinophils Relative: 0 %
HCT: 38.1 % — ABNORMAL LOW (ref 39.0–52.0)
Hemoglobin: 12 g/dL — ABNORMAL LOW (ref 13.0–17.0)
Immature Granulocytes: 1 %
Lymphocytes Relative: 7 %
Lymphs Abs: 0.5 10*3/uL — ABNORMAL LOW (ref 0.7–4.0)
MCH: 32.9 pg (ref 26.0–34.0)
MCHC: 31.5 g/dL (ref 30.0–36.0)
MCV: 104.4 fL — ABNORMAL HIGH (ref 80.0–100.0)
Monocytes Absolute: 0.7 10*3/uL (ref 0.1–1.0)
Monocytes Relative: 10 %
Neutro Abs: 5.7 10*3/uL (ref 1.7–7.7)
Neutrophils Relative %: 82 %
Platelets: 191 10*3/uL (ref 150–400)
RBC: 3.65 MIL/uL — ABNORMAL LOW (ref 4.22–5.81)
RDW: 13.8 % (ref 11.5–15.5)
WBC: 7 10*3/uL (ref 4.0–10.5)
nRBC: 0 % (ref 0.0–0.2)

## 2019-06-09 LAB — POTASSIUM: Potassium: 3.5 mmol/L (ref 3.5–5.1)

## 2019-06-09 LAB — FERRITIN: Ferritin: 400 ng/mL — ABNORMAL HIGH (ref 24–336)

## 2019-06-09 LAB — PHOSPHORUS: Phosphorus: 2.1 mg/dL — ABNORMAL LOW (ref 2.5–4.6)

## 2019-06-09 LAB — MAGNESIUM: Magnesium: 2.3 mg/dL (ref 1.7–2.4)

## 2019-06-09 LAB — MRSA PCR SCREENING: MRSA by PCR: NEGATIVE

## 2019-06-09 MED ORDER — DEXTROSE 5 % IV SOLN
500.0000 mg | Freq: Once | INTRAVENOUS | Status: AC
  Administered 2019-06-09: 500 mg via INTRAVENOUS
  Filled 2019-06-09: qty 500

## 2019-06-09 MED ORDER — POTASSIUM PHOSPHATES 15 MMOLE/5ML IV SOLN
30.0000 mmol | Freq: Once | INTRAVENOUS | Status: AC
  Administered 2019-06-09: 14:00:00 30 mmol via INTRAVENOUS
  Filled 2019-06-09: qty 10

## 2019-06-09 NOTE — Progress Notes (Signed)
NAME:  Ferdie Bakken, MRN:  [id number], DOB:  Jul 04, 1971, LOS: 4 ADMISSION DATE:  06/05/2019, CONSULTATION DATE:  06/05/2019 REFERRING MD:  Lorin Mercy - [facility] , CHIEF COMPLAINT:  SOB  Brief History   [name] COVID-19 +, exhibiting increasing oxygen requirements in ED. Now on NRB.   History of present illness   [name] known COVID-19 positive from community, no known significant medical history who presents to ED with worsening SOB x 2 days. EMS dispatched, patient SpO2 62%. Placed on 15L NRB with SpO2 recovery high 80s-low 90s.Troponin 100 in ED, CTA ordered by admitting hospitalist.  In ED, patient placed on [place] + NRB due to SpO2 in EDs. PCCM consulted to evaluate patient for possible intubation. Upon PCCM evaluation in ED, patient without respiratory distress and appears comfortable. We instruct patient to self-prone on current oxygen therapies and SpO2 recovered to 95%.   Past Medical History  No known [facility] Events   8/6> admit to [facility] under hospitalist service   Consults:  PCCM   Procedures:  06/07/2019 intubation 06/07/2019 right chest tube insertion 06/08/2019 chest tube repositioned  Significant Diagnostic Tests:  8/6 CXR> Diffuse bilateral pulmonary infiltrates   Micro Data:  7/25 SARS CoV2> positive  8/6 CTA on hold due to tenuous pulmonary status 06/07/2019 sputum>> 06/05/2019 urine culture negative 06/05/2019 blood cultures x2>>  Antimicrobials:  8/6 Azithromycin>>>8/10 8/6 Rocephin>>>8/12  Interim history/subjective:   FiO2 down to 70% and PEEP down to 16  Objective   Blood pressure 111/68, pulse 60, temperature 97.8 F (36.6 C), temperature source Oral, resp. rate (!) 28, weight 69.7 kg, SpO2 99 %.    Vent Mode: PRVC FiO2 (%):  [70 %-90 %] 70 % Set Rate:  [35 bmp] 35 bmp Vt Set:  [470 mL] 470 mL PEEP:  [12 cmH20-18 cmH20] 12 cmH20 Plateau Pressure:  [20 cmH20-31 cmH20] 31 cmH20   Intake/Output Summary (Last 24 hours) at 06/09/2019 1119 Last data filed  at 06/09/2019 1000 Gross per 24 hour  Intake 4114.05 ml  Output 1795 ml  Net 2319.05 ml   Filed Weights   06/06/19 0500 06/07/19 0438 06/08/19 0500  Weight: 70.6 kg 68.3 kg 69.7 kg   Examination: General: Acutely ill appearing male, NAD HEENT: [place]/AT, PERRL, EOM-I and MMM, ETT in place Neuro: Sedate, not following command CV: RRR, Nl S1/S2 and -M/R/G PULM: Diminished diffusely and airleak from CT noted GI: Soft, NT, ND and +BS Extremities: warm/dry, negative edema  Skin: no rashes or lesions  I reviewed CXR myself, CT, ETT and infiltrate noted  Resolved Hospital Problem list     Assessment & Plan:  Acute hypoxic resp failure: Secondary to SARS2 infection COVID-19 positive test (U07.1, COVID-19) with Acute Respiratory Failure (J96.00, Acute respiratory failure) Vent dependent respiratory failure secondary to COVID-19 coupled with right pneumothorax intubated on 06/07/2019 acutely  Wean FiO2 as tolerated, goal today if 50% and PEEP of 8, if achieved then will transfer to [facility] assuming there are beds and RNs available Bronchodilators Prone protocol Chest tube was inserted on 06/07/2019 for right pneumothorax and repositioned on 06/08/2019 for recurrent pneumothorax Chest tube to 20 cm suction currently briskly bubbling Serial chest x-ray Serial ABG  SARS2 infection +:  Transfer to [facility] if able to get to 50 and 8 Airborne isolation Continue current interventions with steroids and remdesivir (last day today) D/C zithromax today Place stop date on rocephin for 7 days  Reported to be heavy drinker Continue thiamine  and folic acid Continue sedation protocol  Hypokalemia and phosphatemia: replace and recheck  Best practice:  Diet: npo Pain/Anxiety/Delirium protocol (if indicated): not indicated VAP protocol (if indicated): not indicated DVT prophylaxis: Full-strength Lovenox GI prophylaxis: not indicated Glucose control: ssi Mobility: Bedrest with prone protocol  Code Status: FULL Family Communication: 06/08/2019 no family at bedside Disposition: [facility].  Once stabilized he [facility] be [facility]   Labs   CBC: Recent Labs  Lab 06/05/19 1306 06/05/19 1316  WBC 12.4*  --   NEUTROABS 9.9*  --   HGB 15.0 15.3  HCT 44.9 45.0  MCV 98.9  --   PLT 248  --     Recent Labs  Lab 06/07/19 2105  06/08/19 0213 06/08/19 0821  06/08/19 1200  06/08/19 2200 06/09/19 0337 06/09/19 0553  NA 138   < > 140  143 145  --  145  --   --  146*  --   K 6.4*   < > 4.8  4.8 4.0   < > 3.7   < > 5.4* 3.6 3.5  CL 104  --  105  --   --   --   --   --  105  --   CO2 21*  --  22  --   --   --   --   --  32  --   BUN 47*  --  42*  --   --   --   --   --  19  --   CREATININE 1.66*  --  1.28*  --   --   --   --   --  0.64  --   GLUCOSE 300*  --  251*  --   --   --   --   --  175*  --    < > = values in this interval not displayed.   Recent Labs  Lab 06/07/19 0706  06/08/19 0213 06/08/19 0821 06/08/19 1200 06/09/19 0337  HGB 17.7*   < > 15.1  16.0 13.9 12.6* 12.0*  HCT 55.4*   < > 48.6  47.0 41.0 37.0* 38.1*  WBC 22.3*  --  19.0*  --   --  7.0  PLT 422*  --  371  --   --  191   < > = values in this interval not displayed.     No results for input(s): LIPASE, AMYLASE in the last 168 hours. No results for input(s): AMMONIA in the last 168 hours.  ABG    Component Value Date/Time   PHART 7.499 (H) 06/09/2019 0250   PCO2ART 45.0 06/09/2019 0250   PO2ART 79.3 (L) 06/09/2019 0250   HCO3 35.0 (H) 06/09/2019 0250   TCO2 33 (H) 06/08/2019 1200   ACIDBASEDEF 2.0 06/08/2019 0213   O2SAT 96.4 06/09/2019 0250   Coagulation Profile: Lab Results  Component Value Date   INR 1.3 (H) 06/05/2019    Cardiac Enzymes: No results for input(s): CKTOTAL, CKMB, CKMBINDEX, TROPONINI in the last 168 hours.  HbA1C: No results found for: HGBA1C  CBG: CBG (last 3)  Recent Labs    06/09/19 0055  06/09/19 0321 06/09/19 0808  GLUCAP 157* 172* 108*   The patient is critically ill with multiple organ systems failure and requires high complexity decision making for assessment and support, frequent evaluation and titration of therapies, application of advanced monitoring technologies and extensive interpretation  of multiple databases.   Critical Care Time devoted to patient care services described in this note is  33  Minutes. This time reflects time of care of this signee Dr Koren BoundWesam Jawann Urbani. This critical care time does not reflect procedure time, or teaching time or supervisory time of PA/NP/Med student/Med Resident etc but could involve care discussion time.  Alyson ReedyWesam G. Chestina Komatsu, M.D. [facility] Pulmonary/Critical Care Medicine. Pager: [phone]. After hours pager: [phone].

## 2019-06-09 NOTE — Progress Notes (Signed)
[place] Progress Note Patient Name: Michael Clay DOB: 1971/01/24 MRN: [id number]   Date of Service  06/09/2019  HPI/Events of Note  ABG on 90%/PRVC 35/TV 470/P 18 = 7.499/45.0/79.3  eICU Interventions  Will D/C NaHCO3 IV infusion.      Intervention Category Major Interventions: Acid-Base disturbance - evaluation and management;Respiratory failure - evaluation and management  Sommer,Steven Eugene 06/09/2019, 3:45 AM

## 2019-06-09 NOTE — Progress Notes (Signed)
Pt has been having decreased urine output today and noted urine color to be amber/gray/green in color. MD notified. New orders placed. Will continue to monitor pt.

## 2019-06-10 ENCOUNTER — Inpatient Hospital Stay (HOSPITAL_COMMUNITY): Payer: HRSA Program

## 2019-06-10 DIAGNOSIS — Z9289 Personal history of other medical treatment: Secondary | ICD-10-CM

## 2019-06-10 DIAGNOSIS — J988 Other specified respiratory disorders: Secondary | ICD-10-CM | POA: Diagnosis not present

## 2019-06-10 DIAGNOSIS — U071 COVID-19: Secondary | ICD-10-CM | POA: Diagnosis not present

## 2019-06-10 DIAGNOSIS — J189 Pneumonia, unspecified organism: Secondary | ICD-10-CM | POA: Diagnosis present

## 2019-06-10 DIAGNOSIS — J930 Spontaneous tension pneumothorax: Secondary | ICD-10-CM | POA: Diagnosis not present

## 2019-06-10 DIAGNOSIS — J9601 Acute respiratory failure with hypoxia: Secondary | ICD-10-CM | POA: Diagnosis present

## 2019-06-10 DIAGNOSIS — J80 Acute respiratory distress syndrome: Secondary | ICD-10-CM

## 2019-06-10 DIAGNOSIS — Z9689 Presence of other specified functional implants: Secondary | ICD-10-CM

## 2019-06-10 DIAGNOSIS — F1010 Alcohol abuse, uncomplicated: Secondary | ICD-10-CM

## 2019-06-10 DIAGNOSIS — J181 Lobar pneumonia, unspecified organism: Secondary | ICD-10-CM

## 2019-06-10 DIAGNOSIS — J939 Pneumothorax, unspecified: Secondary | ICD-10-CM | POA: Diagnosis present

## 2019-06-10 LAB — PHOSPHORUS
Phosphorus: 4.3 mg/dL (ref 2.5–4.6)
Phosphorus: 4.1 mg/dL (ref 2.5–4.6)

## 2019-06-10 LAB — GLUCOSE, CAPILLARY
Glucose-Capillary: 97 mg/dL (ref 70–99)
Glucose-Capillary: 98 mg/dL (ref 70–99)
Glucose-Capillary: 125 mg/dL — ABNORMAL HIGH (ref 70–99)
Glucose-Capillary: 142 mg/dL — ABNORMAL HIGH (ref 70–99)
Glucose-Capillary: 119 mg/dL — ABNORMAL HIGH (ref 70–99)

## 2019-06-10 LAB — COMPREHENSIVE METABOLIC PANEL
ALT: 70 U/L — ABNORMAL HIGH (ref 0–44)
ALT: 72 U/L — ABNORMAL HIGH (ref 0–44)
AST: 161 U/L — ABNORMAL HIGH (ref 15–41)
AST: 174 U/L — ABNORMAL HIGH (ref 15–41)
Albumin: 2.2 g/dL — ABNORMAL LOW (ref 3.5–5.0)
Albumin: 2 g/dL — ABNORMAL LOW (ref 3.5–5.0)
Alkaline Phosphatase: 79 U/L (ref 38–126)
Alkaline Phosphatase: 78 U/L (ref 38–126)
Anion gap: 10 (ref 5–15)
Anion gap: 8 (ref 5–15)
BUN: 28 mg/dL — ABNORMAL HIGH (ref 6–20)
BUN: 30 mg/dL — ABNORMAL HIGH (ref 6–20)
CO2: 29 mmol/L (ref 22–32)
CO2: 29 mmol/L (ref 22–32)
Calcium: 7.8 mg/dL — ABNORMAL LOW (ref 8.9–10.3)
Calcium: 7.7 mg/dL — ABNORMAL LOW (ref 8.9–10.3)
Chloride: 105 mmol/L (ref 98–111)
Chloride: 108 mmol/L (ref 98–111)
Creatinine, Ser: 0.69 mg/dL (ref 0.61–1.24)
Creatinine, Ser: 0.55 mg/dL — ABNORMAL LOW (ref 0.61–1.24)
GFR calc Af Amer: >60 mL/min (ref >60)
GFR calc Af Amer: >60 mL/min (ref >60)
GFR calc non Af Amer: >60 mL/min (ref >60)
GFR calc non Af Amer: >60 mL/min (ref >60)
Glucose, Bld: 104 mg/dL — ABNORMAL HIGH (ref 70–99)
Glucose, Bld: 128 mg/dL — ABNORMAL HIGH (ref 70–99)
Potassium: 4.5 mmol/L (ref 3.5–5.1)
Potassium: 4.6 mmol/L (ref 3.5–5.1)
Sodium: 144 mmol/L (ref 135–145)
Sodium: 145 mmol/L (ref 135–145)
Total Bilirubin: 0.5 mg/dL (ref 0.3–1.2)
Total Bilirubin: 0.8 mg/dL (ref 0.3–1.2)
Total Protein: 5.8 g/dL — ABNORMAL LOW (ref 6.5–8.1)
Total Protein: 5.4 g/dL — ABNORMAL LOW (ref 6.5–8.1)

## 2019-06-10 LAB — TRIGLYCERIDES: Triglycerides: 99 mg/dL (ref <150)

## 2019-06-10 LAB — CBC
HCT: 43.5 % (ref 39.0–52.0)
Hemoglobin: 13.1 g/dL (ref 13.0–17.0)
MCH: 32.1 pg (ref 26.0–34.0)
MCHC: 30.1 g/dL (ref 30.0–36.0)
MCV: 106.6 fL — ABNORMAL HIGH (ref 80.0–100.0)
Platelets: 222 10*3/uL (ref 150–400)
RBC: 4.08 MIL/uL — ABNORMAL LOW (ref 4.22–5.81)
RDW: 14.3 % (ref 11.5–15.5)
WBC: 10.5 10*3/uL (ref 4.0–10.5)
nRBC: 0 % (ref 0.0–0.2)

## 2019-06-10 LAB — POCT I-STAT 7, (LYTES, BLD GAS, ICA,H+H)
Acid-Base Excess: 6 mmol/L — ABNORMAL HIGH (ref 0.0–2.0)
Bicarbonate: 31.7 mmol/L — ABNORMAL HIGH (ref 20.0–28.0)
Calcium, Ion: 1.11 mmol/L — ABNORMAL LOW (ref 1.15–1.40)
HCT: 40 % (ref 39.0–52.0)
Hemoglobin: 13.6 g/dL (ref 13.0–17.0)
O2 Saturation: 89 %
Patient temperature: 99.4
Potassium: 3.9 mmol/L (ref 3.5–5.1)
Sodium: 145 mmol/L (ref 135–145)
TCO2: 33 mmol/L — ABNORMAL HIGH (ref 22–32)
pCO2 arterial: 48.2 mmHg — ABNORMAL HIGH (ref 32.0–48.0)
pH, Arterial: 7.428 (ref 7.350–7.450)
pO2, Arterial: 57 mmHg — ABNORMAL LOW (ref 83.0–108.0)

## 2019-06-10 LAB — CK: Total CK: 92 U/L (ref 49–397)

## 2019-06-10 LAB — CULTURE, BLOOD (ROUTINE X 2)
Culture: NO GROWTH
Culture: NO GROWTH

## 2019-06-10 LAB — FERRITIN
Ferritin: 532 ng/mL — ABNORMAL HIGH (ref 24–336)
Ferritin: 465 ng/mL — ABNORMAL HIGH (ref 24–336)

## 2019-06-10 LAB — MAGNESIUM
Magnesium: 2.2 mg/dL (ref 1.7–2.4)
Magnesium: 2 mg/dL (ref 1.7–2.4)

## 2019-06-10 LAB — C-REACTIVE PROTEIN: CRP: 7.5 mg/dL — ABNORMAL HIGH (ref <1.0)

## 2019-06-10 LAB — D-DIMER, QUANTITATIVE: D-Dimer, Quant: 2.78 ug/mL-FEU — ABNORMAL HIGH (ref 0.00–0.50)

## 2019-06-10 MED ORDER — FUROSEMIDE 10 MG/ML IJ SOLN
40.0000 mg | Freq: Once | INTRAMUSCULAR | Status: AC
  Administered 2019-06-10: 40 mg via INTRAVENOUS
  Filled 2019-06-10: qty 4

## 2019-06-10 MED ORDER — FUROSEMIDE 10 MG/ML IJ SOLN
40.0000 mg | Freq: Once | INTRAMUSCULAR | Status: AC
  Administered 2019-06-10: 12:00:00 40 mg via INTRAVENOUS
  Filled 2019-06-10: qty 4

## 2019-06-10 NOTE — Progress Notes (Addendum)
NAME:  Khamron Gellert, MRN:  [id number], DOB:  [birth year], LOS: 5 ADMISSION DATE:  06/05/2019, CONSULTATION DATE:  06/05/2019 REFERRING MD:  [facility] , CHIEF COMPLAINT:  SOB  Brief History   [name] COVID-19 +, exhibiting increasing oxygen requirements in ED. Now on NRB.   History of present illness   [name] known COVID-19 positive from community, no known significant medical history who presents to ED with worsening SOB x 2 days. EMS dispatched, patient SpO2 62%. Placed on 15L NRB with SpO2 recovery high 80s-low 90s.Troponin 100 in ED, CTA ordered by admitting hospitalist.  In ED, patient placed on [place] + NRB due to SpO2 in EDs. PCCM consulted to evaluate patient for possible intubation. Upon PCCM evaluation in ED, patient without respiratory distress and appears comfortable. We instruct patient to self-prone on current oxygen therapies and SpO2 recovered to 95%.   Past Medical History  No known [place] [facility] Events   8/6> admit to [facility] under hospitalist service   Consults:  PCCM   Procedures:  06/07/2019 intubation 06/07/2019 right chest tube insertion 06/08/2019 chest tube repositioned  Significant Diagnostic Tests:  8/6 CXR> Diffuse bilateral pulmonary infiltrates   Micro Data:  7/25 SARS CoV2> positive  8/6 CTA on hold due to tenuous pulmonary status 06/07/2019 sputum>> 06/05/2019 urine culture negative 06/05/2019 blood cultures x2>>  Antimicrobials:  8/6 Azithromycin>>>8/10 8/6 Rocephin>>>8/12  Interim history/subjective:   Transferred on FIO2 100%. Weaned to 60% and PEEP 5  Objective   Blood pressure 111/72, pulse (!) 101, temperature 98.9 F (37.2 C), temperature source Oral, resp. rate 19, height 5\' 2"  (1.575 m), weight 76.5 kg, SpO2 95 %.    Vent Mode: PRVC FiO2 (%):  [50 %-100 %] 60 % Set Rate:  [28 bmp-35 bmp] 28 bmp Vt Set:  [440 mL-470 mL] 440 mL PEEP:  [5 cmH20-8 cmH20] 5 cmH20 Plateau Pressure:  [21 cmH20-27 cmH20] 25 cmH20   Intake/Output Summary  (Last 24 hours) at 06/10/2019 1705 Last data filed at 06/10/2019 1346 Gross per 24 hour  Intake 1921.82 ml  Output 2115 ml  Net -193.18 ml   Filed Weights   06/07/19 0438 06/08/19 0500 06/10/19 0500  Weight: 68.3 kg 69.7 kg 76.5 kg   Examination: General: Critically ill-appearing, in bed on vent HENT: NCAT ETT in place PULM: Diminished breath sounds bilaterally, vent supported breathing, right chest tube in place with no evidence of airleak CV: RRR, no mgr GI: BS+, soft, nontender MSK: normal bulk and tone  Neuro: Eyes closed however follows commands and nods appropriately to questions, moves extremities x 4  Resolved Hospital Problem list     Assessment & Plan:  ARDS due to COVID-19 pneumonia: C/b right PTX S/p remdesivir. Did not receive Actemra prior to transfer to [facility] however will defer at this time due to improving oxygenation and CRP <10 at time of transfer. Continue steroids x 10 days Continue mechanical ventilation per ARDS protocol Target TVol 6-8cc/kgIBW Target Plateau Pressure < 30cm H20 Target driving pressure less than 15 cm of water Target PaO2 55-65: titrate PEEP/FiO2 per protocol Check CVP daily if CVL in place Target CVP less than 4, diurese as necessary Ventilator associated pneumonia prevention protocol 8/11: Improved oxygenation and vent settings. Proning protocol discontinued. Continue diuresis  Right pneumothorax s/p chest tube Keep on suction until extubated  Reported to be heavy drinker Continue thiamine and folic acid Continue sedation protocol  Best practice:  Diet: npo Pain/Anxiety/Delirium protocol (if  indicated): not indicated VAP protocol (if indicated): not indicated DVT prophylaxis: Full-strength Lovenox GI prophylaxis: not indicated Glucose control: ssi Mobility: Bedrest with prone protocol Code Status: FULL Family Communication: 06/08/2019 no family at bedside Disposition: GVC ICU   Labs   CBC: Recent Labs  Lab 06/05/19 1306  06/05/19 1316  WBC 12.4*  --   NEUTROABS 9.9*  --   HGB 15.0 15.3  HCT 44.9 45.0  MCV 98.9  --   PLT 248  --     Recent Labs  Lab 06/09/19 0337  06/10/19 0334 06/10/19 0350 06/10/19 1215  NA 146*   < > 142 144 145  K 3.6   < > 7.0* 4.5 4.6  CL 105  --   --  105 108  CO2 32  --   --  29 29  BUN 19  --   --  28* 30*  CREATININE 0.64  --   --  0.69 0.55*  GLUCOSE 175*  --   --  104* 128*   < > = values in this interval not displayed.   Recent Labs  Lab 06/08/19 0213  06/09/19 0337 06/10/19 0002 06/10/19 0334 06/10/19 0350  HGB 15.1  16.0   < > 12.0* 13.6 13.6 13.1  HCT 48.6  47.0   < > 38.1* 40.0 40.0 43.5  WBC 19.0*  --  7.0  --   --  10.5  PLT 371  --  191  --   --  222   < > = values in this interval not displayed.     No results for input(s): LIPASE, AMYLASE in the last 168 hours. No results for input(s): AMMONIA in the last 168 hours.  ABG    Component Value Date/Time   PHART 7.417 06/10/2019 0334   PCO2ART 53.5 (H) 06/10/2019 0334   PO2ART 73.0 (L) 06/10/2019 0334   HCO3 34.3 (H) 06/10/2019 0334   TCO2 36 (H) 06/10/2019 0334   ACIDBASEDEF 2.0 06/08/2019 0213   O2SAT 94.0 06/10/2019 0334   Coagulation Profile: Lab Results  Component Value Date   INR 1.3 (H) 06/05/2019    Cardiac Enzymes: No results for input(s): CKTOTAL, CKMB, CKMBINDEX, TROPONINI in the last 168 hours.  HbA1C: No results found for: HGBA1C  CBG: CBG (last 3)  Recent Labs    06/10/19 0734 06/10/19 1237 06/10/19 1619  GLUCAP 98 125* 142*   COVID-19 Labs  Recent Labs    06/07/19 1750  06/08/19 0213 06/09/19 0337 06/10/19 0350 06/10/19 1215  DDIMER 5.44*  --  2.49*  --   --  2.78*  FERRITIN  --    < > 594* 400* 532* 465*  CRP 15.6*  --  12.1*  --   --  7.5*   < > = values in this interval not displayed.    Lab Results  Component Value Date   SARSCOV2NAA DETECTED (A) 05/24/2019   Critical Care: 38 minutes  The patient is critically ill with multiple organ  systems failure and requires high complexity decision making for assessment and support, frequent evaluation and titration of therapies, application of advanced monitoring technologies and extensive interpretation of multiple databases.   Discussed and co-managed patient care with PCCM-Hospitalist. Coordinated care with RT, RN and pharmacist.  Mechele CollinJane Abbagale Goguen, M.D. [facility] 06/10/2019 5:17 PM  Pager: [phone] After hours pager: [phone]

## 2019-06-10 NOTE — Progress Notes (Signed)
2000- Patient's family called and updated

## 2019-06-10 NOTE — Progress Notes (Signed)
UOP since pt's arival has been 25 ml and color is dark green with sediment and cloudy. ABG showed a potassium of 7.0. Morning labs were drawn using the RIJ central line and sent after calling the phlebotomist and changing the order to stat blood draws. Pt's abdomen was firm so performed a bladder scan which showed greater than 999 ml. Irrigated the pt's foley catheter using sterile technique and 30 ml of normal saline. Immediately 1175 ml of urine came out into the foley bag. Will continue to monitor and waiting for CMP results.

## 2019-06-10 NOTE — Progress Notes (Signed)
[facility] Progress Note Patient Name: Michael Clay DOB: 06-15-1971 MRN: [id number]   Date of Service  06/10/2019  HPI/Events of Note  Multiple issues: 1. ABG on 100%/PRVC 28/TV 470/P 8 = 7.417/53.5/73.0 and 2. K+ on ABG = 7.0. QRS not widened or T wave peaked on bedside monitor.  eICU Interventions  Will order: 1. Continue present ventilator management.  2. Await K+ for BMP.     Intervention Category Major Interventions: Respiratory failure - evaluation and management;Electrolyte abnormality - evaluation and management  Sommer,Steven Eugene 06/10/2019, 4:23 AM

## 2019-06-10 NOTE — Progress Notes (Addendum)
PROGRESS NOTE    Michael Clay  CHY:[id number] DOB: November 20, 1970 DOA: 06/05/2019 PCP: Patient, No Pcp Per   Brief Narrative:  Michael Clay is a 48 y.o.  Hispanic male PMHx ETOH abuse    Presenting with worsening SOB in the setting of COVID.  He has not received care through Epic prior to an initial Urgent Care visit on 7/25.  At that time, he reported subjective fever starting 5 days prior (about 7/20).  He was afebrile at that visit and was tested for COVID.   He was notified on 7/30 that his COVID test was positive.  He called back yesterday with c/o persistent cough and was called in [place].  He called back today and reported SOB with O2 sats 69% at home.  EMS was called and sats were 66% with patient walking around his house, improved to 91% on 15L NRB. Denies significant cough.   ED Course: Diagnosed with COVID 3 days ago.  Worsening SOB x 2 days.  62% with EMS while ambulating. Difficulty getting sats up.  Placed on 15L NRB to low 90s, dropped to 80s.  Plan for high flow [place] but can't do that at [facility].  Placed on [place] O2 and also NRB and now finally with enough O2, improved WOB.  Troponin 100, CTA ordered to determine heparin or not.  May need PCCM consult.  Subjective: Alert, opens his eyes follows commands   Assessment & Plan:   Principal Problem:   Respiratory tract infection due to COVID-19 virus Active Problems:   COVID-19   Tension pneumothorax, spontaneous   Acute respiratory failure with hypoxia (HCC)   Pneumonia due to COVID-19 virus   Pneumothorax on right   CAP (community acquired pneumonia)   ETOH abuse  Acute respiratory failure with hypoxia/COVID pneumonia Recent Labs  Lab 06/06/19 1641 06/07/19 0706 06/07/19 1750 06/08/19 0213 06/10/19 1215  CRP 21.4* 19.4* 15.6* 12.1* 7.5*   Recent Labs  Lab 06/10/19 1215  DDIMER 2.78*  -Decadron 6 mg daily -Remdesivir per pharmacy protocol - Low dose Lovenox based on d-dimer -After discussing patient's case decided  not to proceed with Actemra although patient meets guidelines secondary to his continued improvement. -Patient had received antibiotics prior to admission to [facility] see below  Acute RIGHT pneumothorax -Intubated 8/8.  Reposition on 8/9 for recurrent pneumothorax -Negative water leak - Chest tube suction 20 cm H2O; once pneumothorax resolved and patient titrated to waterseal will remove chest tube - Daily PCXR - Daily ABG  CAP -Patient started on antibiotics for CAP prior to admission, complete course -  EtOH abuse  - 8/7 urine tox screen negative - EtOH screen not obtained - Folic acid 1 mg daily - In 100 mg daily     DVT prophylaxis: Lovenox BID Code Status: Full Family Communication: 8/11; contacted wife Ms. Carson Bogden who only speaks a little English requested that I contact her in the morning with a translator in order to update her on the plan of care for her husband. Disposition Plan: TBD   Consultants:  PCCM  Procedures/Significant Events:  06/07/2019 intubation #7.5 cuffed 06/07/2019 right chest tube insertion 06/08/2019 chest tube repositioned   I have personally reviewed and interpreted all radiology studies and my findings are as above.  VENTILATOR SETTINGS: Vent mode; PRVC Vt Set: 440 Set rate: 28 FiO2: 60% I time: 0.75 PEEP: 5   Cultures 7/25 SARS CoV2> positive  8/6 CTA on hold due to tenuous pulmonary status 06/07/2019 sputum>> 06/05/2019 urine  culture negative 06/05/2019 blood cultures x2>>      Antimicrobials: Anti-infectives (From admission, onward)   Start     Stop   06/06/19 2000  remdesivir 100 mg in sodium chloride 0.9 % 250 mL IVPB     06/09/19 2020   06/06/19 1400  azithromycin (ZITHROMAX) 500 mg in sodium chloride 0.9 % 250 mL IVPB     06/09/19 1625   06/06/19 1200  cefTRIAXone (ROCEPHIN) 1 g in sodium chloride 0.9 % 100 mL IVPB     06/11/19 2359   06/05/19 2000  remdesivir 200 mg in sodium chloride 0.9 % 250 mL IVPB      06/05/19 2138   06/05/19 1400  cefTRIAXone (ROCEPHIN) 1 g in sodium chloride 0.9 % 100 mL IVPB     06/05/19 1503   06/05/19 1400  azithromycin (ZITHROMAX) 500 mg in sodium chloride 0.9 % 250 mL IVPB     06/05/19 1503       Devices   LINES / TUBES:  #7.5 cuffed ETT 8/8>> RIGHT chest tube insertion 8/8>>      Continuous Infusions: . sodium chloride Stopped (06/07/19 1658)  . sodium chloride Stopped (06/10/19 1227)  . cefTRIAXone (ROCEPHIN)  IV 200 mL/hr at 06/10/19 1300  . feeding supplement (VITAL 1.5 CAL) 35 mL/hr at 06/10/19 1200  . fentaNYL infusion INTRAVENOUS 150 mcg/hr (06/10/19 1325)  . norepinephrine (LEVOPHED) Adult infusion Stopped (06/08/19 2302)  . propofol (DIPRIVAN) infusion 25 mcg/kg/min (06/10/19 1743)     Objective: Vitals:   06/10/19 1500 06/10/19 1541 06/10/19 1600 06/10/19 1700  BP: 108/70 111/63 111/72 107/68  Pulse: 95 (!) 105 (!) 101 99  Resp: '18 17 19 17  ' Temp:   98.9 F (37.2 C)   TempSrc:   Oral   SpO2: 97% 95% 95% 95%  Weight:      Height:        Intake/Output Summary (Last 24 hours) at 06/10/2019 1819 Last data filed at 06/10/2019 1346 Gross per 24 hour  Intake 1757.63 ml  Output 2115 ml  Net -357.37 ml   Filed Weights   06/07/19 0438 06/08/19 0500 06/10/19 0500  Weight: 68.3 kg 69.7 kg 76.5 kg    Examination:  General: Alert, follows commands, positive acute respiratory distress Eyes: negative scleral hemorrhage, negative anisocoria, negative icterus ENT: Negative Runny nose, negative gingival bleeding, # 7.5 ETT in place negative signs of bleeding or infection Neck:  Negative scars, masses, torticollis, lymphadenopathy, JVD Lungs: Clear to auscultation bilaterally without wheezes or crackles Cardiovascular: Regular rate and rhythm without murmur gallop or rub normal S1 and S2 Abdomen: negative abdominal pain, nondistended, positive soft, bowel sounds, no rebound, no ascites, no appreciable mass Extremities: No significant  cyanosis, clubbing, or edema bilateral lower extremities Skin: Negative rashes, lesions, ulcers Psychiatric:  Negative depression, negative anxiety, negative fatigue, negative mania  Central nervous system:  Cranial nerves II through XII intact, tongue/uvula midline, all extremities muscle strength 5/5, sensation intact throughout, negative dysarthria, negative expressive aphasia, negative receptive aphasia.  .     Data Reviewed: Care during the described time interval was provided by me .  I have reviewed this patient's available data, including medical history, events of note, physical examination, and all test results as part of my evaluation.   CBC: Recent Labs  Lab 06/05/19 1306  06/06/19 0513  06/07/19 0706  06/08/19 0213  06/08/19 1200 06/09/19 0337 06/10/19 0002 06/10/19 0334 06/10/19 0350  WBC 12.4*  --  12.9*  --  22.3*  --  19.0*  --   --  7.0  --   --  10.5  NEUTROABS 9.9*  --  10.3*  --  16.8*  --  16.3*  --   --  5.7  --   --   --   HGB 15.0   < > 14.7   < > 17.7*   < > 15.1  16.0   < > 12.6* 12.0* 13.6 13.6 13.1  HCT 44.9   < > 45.0   < > 55.4*   < > 48.6  47.0   < > 37.0* 38.1* 40.0 40.0 43.5  MCV 98.9  --  98.9  --  102.0*  --  105.0*  --   --  104.4*  --   --  106.6*  PLT 248  --  241  --  422*  --  371  --   --  191  --   --  222   < > = values in this interval not displayed.   Basic Metabolic Panel: Recent Labs  Lab 06/07/19 0706  06/07/19 2105  06/08/19 0213  06/09/19 0337 06/09/19 0553 06/10/19 0002 06/10/19 0334 06/10/19 0350 06/10/19 1215  NA 140   < > 138   < > 140  143   < > 146*  --  145 142 144 145  K 5.2*   < > 6.4*   < > 4.8  4.8   < > 3.6 3.5 3.9 7.0* 4.5 4.6  CL 106  --  104  --  105  --  105  --   --   --  105 108  CO2 20*  --  21*  --  22  --  32  --   --   --  29 29  GLUCOSE 145*  --  300*  --  251*  --  175*  --   --   --  104* 128*  BUN 27*  --  47*  --  42*  --  19  --   --   --  28* 30*  CREATININE 0.79  --  1.66*  --  1.28*   --  0.64  --   --   --  0.69 0.55*  CALCIUM 8.4*  --  7.4*  --  7.7*  --  7.2*  --   --   --  7.8* 7.7*  MG 2.6*  --   --   --  2.6*  --  2.3  --   --   --  2.2 2.0  PHOS 6.6*  --   --   --  6.0*  --  2.1*  --   --   --  4.3 4.1   < > = values in this interval not displayed.   GFR: Estimated Creatinine Clearance: 100.2 mL/min (A) (by C-G formula based on SCr of 0.55 mg/dL (L)). Liver Function Tests: Recent Labs  Lab 06/07/19 0706 06/08/19 0213 06/09/19 0337 06/10/19 0350 06/10/19 1215  AST 59* 36 46* 161* 174*  ALT 49* 39 32 70* 72*  ALKPHOS 113 83 59 79 78  BILITOT 1.4* 0.9 0.5 0.5 0.8  PROT 7.7 5.9* 4.7* 5.8* 5.4*  ALBUMIN 2.4* 2.0* 1.6* 2.2* 2.0*   No results for input(s): LIPASE, AMYLASE in the last 168 hours. No results for input(s): AMMONIA in the last 168 hours. Coagulation Profile: Recent Labs  Lab 06/05/19 1306  INR 1.3*   Cardiac Enzymes: Recent Labs  Lab 06/10/19  1215  CKTOTAL 92   BNP (last 3 results) No results for input(s): PROBNP in the last 8760 hours. HbA1C: Recent Labs    06/08/19 1059  HGBA1C 5.8*   CBG: Recent Labs  Lab 06/09/19 2318 06/10/19 0311 06/10/19 0734 06/10/19 1237 06/10/19 1619  GLUCAP 94 97 98 125* 142*   Lipid Profile: Recent Labs    06/10/19 0350  TRIG 99   Thyroid Function Tests: No results for input(s): TSH, T4TOTAL, FREET4, T3FREE, THYROIDAB in the last 72 hours. Anemia Panel: Recent Labs    06/10/19 0350 06/10/19 1215  FERRITIN 532* 465*   Urine analysis:    Component Value Date/Time   COLORURINE YELLOW 06/05/2019 1459   APPEARANCEUR CLEAR 06/05/2019 1459   LABSPEC 1.011 06/05/2019 1459   PHURINE 6.0 06/05/2019 1459   GLUCOSEU NEGATIVE 06/05/2019 1459   HGBUR NEGATIVE 06/05/2019 1459   BILIRUBINUR NEGATIVE 06/05/2019 1459   KETONESUR 5 (A) 06/05/2019 1459   PROTEINUR NEGATIVE 06/05/2019 1459   NITRITE NEGATIVE 06/05/2019 1459   LEUKOCYTESUR NEGATIVE 06/05/2019 1459   Sepsis Labs:  '@LABRCNTIP' (procalcitonin:4,lacticidven:4)  ) Recent Results (from the past 240 hour(s))  Blood Culture (routine x 2)     Status: None   Collection Time: 06/05/19  1:30 PM   Specimen: BLOOD  Result Value Ref Range Status   Specimen Description BLOOD RIGHT ANTECUBITAL  Final   Special Requests   Final    BOTTLES DRAWN AEROBIC AND ANAEROBIC Blood Culture results may not be optimal due to an inadequate volume of blood received in culture bottles   Culture   Final    NO GROWTH 5 DAYS Performed at [facility], [place] [address] [place], [place] [address]    Report Status 06/10/2019 FINAL  Final  Blood Culture (routine x 2)     Status: None   Collection Time: 06/05/19  1:54 PM   Specimen: BLOOD LEFT HAND  Result Value Ref Range Status   Specimen Description BLOOD LEFT HAND  Final   Special Requests   Final    BOTTLES DRAWN AEROBIC AND ANAEROBIC Blood Culture results may not be optimal due to an inadequate volume of blood received in culture bottles   Culture   Final    NO GROWTH 5 DAYS Performed at [facility], [place] [address] [place], [place] [address]    Report Status 06/10/2019 FINAL  Final  Urine culture     Status: None   Collection Time: 06/05/19  2:59 PM   Specimen: In/Out Cath Urine  Result Value Ref Range Status   Specimen Description IN/OUT CATH URINE  Final   Special Requests NONE  Final   Culture   Final    NO GROWTH Performed at [facility], [place] [address], [place], [place] [address]    Report Status 06/06/2019 FINAL  Final  MRSA PCR Screening     Status: None   Collection Time: 06/09/19  3:50 PM   Specimen: Nasopharyngeal  Result Value Ref Range Status   MRSA by PCR NEGATIVE NEGATIVE Final    Comment:        The GeneXpert MRSA Assay (FDA approved for NASAL specimens only), is one component of a comprehensive MRSA colonization surveillance program. It is not intended to diagnose MRSA infection nor to guide or monitor treatment for MRSA  infections. Performed at [facility], [place] [address], [place], [place] [address]   Culture, respiratory (non-expectorated)     Status: None (Preliminary result)  Collection Time: 06/10/19  5:09 AM   Specimen: Tracheal Aspirate; Respiratory  Result Value Ref Range Status   Specimen Description   Final    TRACHEAL ASPIRATE Performed at [place] [address], [place], [place] [address]    Special Requests   Final    Normal Performed at [facility], [place] [address], [place], [place] [address]    Gram Stain   Final    FEW WBC PRESENT, PREDOMINANTLY PMN FEW GRAM POSITIVE COCCI IN PAIRS IN CLUSTERS RARE GRAM NEGATIVE RODS Performed at [facility], [place] [address], [place], [place] [address]    Culture PENDING  Incomplete   Report Status PENDING  Incomplete         Radiology Studies: Dg Chest Port 1 View  Result Date: 06/10/2019 CLINICAL DATA:  Endotracheal tube placement. EXAM: PORTABLE CHEST 1 VIEW COMPARISON:  Radiograph of June 09, 2019. FINDINGS: Stable cardiomediastinal silhouette. Stable position of endotracheal and nasogastric tubes. Stable right internal jugular catheter is noted. Stable position of right-sided chest tube. No definite pneumothorax is noted. Stable bilateral lung opacities are noted consistent with multifocal pneumonia. No significant pleural effusion is noted. Bony thorax is unremarkable. IMPRESSION: Stable support apparatus. Stable position of right-sided chest tube without definite pneumothorax. Stable bilateral lung opacities as described above. Electronically Signed   By: Marijo Conception M.D.   On: 06/10/2019 08:56   Dg Chest Port 1 View  Result Date: 06/09/2019 CLINICAL DATA:  Respiratory failure. EXAM: PORTABLE CHEST 1 VIEW COMPARISON:  Radiograph of June 08, 2019. FINDINGS: The heart size and mediastinal contours are within normal limits. Endotracheal and nasogastric tubes are unchanged in  position. Right internal jugular catheter is unchanged in position. Right-sided chest tube is unchanged in position. No definite pneumothorax is noted. Subcutaneous emphysema is seen over right lateral chest wall has resolved. Stable bilateral lung opacities are noted. The visualized skeletal structures are unremarkable. IMPRESSION: Stable support apparatus. Stable position of right-sided chest tube without definite pneumothorax. Stable bilateral lung opacities as described above. Electronically Signed   By: Marijo Conception M.D.   On: 06/09/2019 07:56        Scheduled Meds: . chlorhexidine gluconate (MEDLINE KIT)  15 mL Mouth Rinse BID  . Chlorhexidine Gluconate Cloth  6 each Topical Daily  . dexamethasone (DECADRON) injection  6 mg Intravenous Q24H  . docusate  100 mg Per Tube BID  . enoxaparin (LOVENOX) injection  1 mg/kg Subcutaneous Q12H  . feeding supplement (PRO-STAT SUGAR FREE 64)  30 mL Per Tube TID  . folic acid  1 mg Intravenous Daily  . furosemide  40 mg Intravenous Once  . insulin aspart  0-9 Units Subcutaneous Q4H  . ipratropium-albuterol  3 mL Nebulization TID  . mouth rinse  15 mL Mouth Rinse 10 times per day  . multivitamin  15 mL Per Tube Daily  . pantoprazole (PROTONIX) IV  40 mg Intravenous Daily  . sodium chloride flush  3 mL Intravenous Q12H  . sodium chloride flush  3 mL Intravenous Q12H  . thiamine injection  100 mg Intravenous Daily   Continuous Infusions: . sodium chloride Stopped (06/07/19 1658)  . sodium chloride Stopped (06/10/19 1227)  . cefTRIAXone (ROCEPHIN)  IV 200 mL/hr at 06/10/19 1300  . feeding supplement (VITAL 1.5 CAL) 35 mL/hr at 06/10/19 1200  . fentaNYL infusion INTRAVENOUS 150 mcg/hr (06/10/19 1325)  . norepinephrine (LEVOPHED) Adult infusion Stopped (06/08/19 2302)  . propofol (DIPRIVAN)  infusion 25 mcg/kg/min (06/10/19 1743)     LOS: 5 days   The patient is critically ill with multiple organ systems failure and requires high complexity  decision making for assessment and support, frequent evaluation and titration of therapies, application of advanced monitoring technologies and extensive interpretation of multiple databases. Critical Care Time devoted to patient care services described in this note  Time spent: 40 minutes     Jeylin Woodmansee, Geraldo Docker, MD [facility] Pager [phone]  If 7PM-7AM, please contact night-coverage [email or web address] Password [facility] 06/10/2019, 6:19 PM

## 2019-06-11 ENCOUNTER — Inpatient Hospital Stay (HOSPITAL_COMMUNITY): Payer: HRSA Program

## 2019-06-11 DIAGNOSIS — J930 Spontaneous tension pneumothorax: Secondary | ICD-10-CM | POA: Diagnosis not present

## 2019-06-11 DIAGNOSIS — J988 Other specified respiratory disorders: Secondary | ICD-10-CM | POA: Diagnosis not present

## 2019-06-11 DIAGNOSIS — J1289 Other viral pneumonia: Secondary | ICD-10-CM

## 2019-06-11 DIAGNOSIS — Z4682 Encounter for fitting and adjustment of non-vascular catheter: Secondary | ICD-10-CM

## 2019-06-11 DIAGNOSIS — U071 COVID-19: Secondary | ICD-10-CM | POA: Diagnosis not present

## 2019-06-11 LAB — GLUCOSE, CAPILLARY
Glucose-Capillary: 107 mg/dL — ABNORMAL HIGH (ref 70–99)
Glucose-Capillary: 107 mg/dL — ABNORMAL HIGH (ref 70–99)
Glucose-Capillary: 118 mg/dL — ABNORMAL HIGH (ref 70–99)
Glucose-Capillary: 125 mg/dL — ABNORMAL HIGH (ref 70–99)
Glucose-Capillary: 129 mg/dL — ABNORMAL HIGH (ref 70–99)
Glucose-Capillary: 91 mg/dL (ref 70–99)
Glucose-Capillary: 96 mg/dL (ref 70–99)

## 2019-06-11 LAB — POCT I-STAT 7, (LYTES, BLD GAS, ICA,H+H)
Acid-Base Excess: 10 mmol/L — ABNORMAL HIGH (ref 0.0–2.0)
Acid-Base Excess: 11 mmol/L — ABNORMAL HIGH (ref 0.0–2.0)
Bicarbonate: 34.8 mmol/L — ABNORMAL HIGH (ref 20.0–28.0)
Bicarbonate: 37.9 mmol/L — ABNORMAL HIGH (ref 20.0–28.0)
Calcium, Ion: 1.18 mmol/L (ref 1.15–1.40)
Calcium, Ion: 1.18 mmol/L (ref 1.15–1.40)
HCT: 36 % — ABNORMAL LOW (ref 39.0–52.0)
HCT: 38 % — ABNORMAL LOW (ref 39.0–52.0)
Hemoglobin: 12.2 g/dL — ABNORMAL LOW (ref 13.0–17.0)
Hemoglobin: 12.9 g/dL — ABNORMAL LOW (ref 13.0–17.0)
O2 Saturation: 87 %
O2 Saturation: 85 %
Patient temperature: 99.3
Potassium: 5.1 mmol/L (ref 3.5–5.1)
Potassium: 4.1 mmol/L (ref 3.5–5.1)
Sodium: 143 mmol/L (ref 135–145)
Sodium: 146 mmol/L — ABNORMAL HIGH (ref 135–145)
TCO2: 36 mmol/L — ABNORMAL HIGH (ref 22–32)
TCO2: 40 mmol/L — ABNORMAL HIGH (ref 22–32)
pCO2 arterial: 48.3 mmHg — ABNORMAL HIGH (ref 32.0–48.0)
pCO2 arterial: 56.8 mmHg — ABNORMAL HIGH (ref 32.0–48.0)
pH, Arterial: 7.468 — ABNORMAL HIGH (ref 7.350–7.450)
pH, Arterial: 7.432 (ref 7.350–7.450)
pO2, Arterial: 52 mmHg — ABNORMAL LOW (ref 83.0–108.0)
pO2, Arterial: 50 mmHg — ABNORMAL LOW (ref 83.0–108.0)

## 2019-06-11 LAB — COMPREHENSIVE METABOLIC PANEL
ALT: 62 U/L — ABNORMAL HIGH (ref 0–44)
AST: 78 U/L — ABNORMAL HIGH (ref 15–41)
Albumin: 2 g/dL — ABNORMAL LOW (ref 3.5–5.0)
Alkaline Phosphatase: 76 U/L (ref 38–126)
Anion gap: 7 (ref 5–15)
BUN: 33 mg/dL — ABNORMAL HIGH (ref 6–20)
CO2: 36 mmol/L — ABNORMAL HIGH (ref 22–32)
Calcium: 7.9 mg/dL — ABNORMAL LOW (ref 8.9–10.3)
Chloride: 103 mmol/L (ref 98–111)
Creatinine, Ser: 0.62 mg/dL (ref 0.61–1.24)
GFR calc Af Amer: >60 mL/min (ref >60)
GFR calc non Af Amer: >60 mL/min (ref >60)
Glucose, Bld: 89 mg/dL (ref 70–99)
Potassium: 4 mmol/L (ref 3.5–5.1)
Sodium: 146 mmol/L — ABNORMAL HIGH (ref 135–145)
Total Bilirubin: 0.6 mg/dL (ref 0.3–1.2)
Total Protein: 5.6 g/dL — ABNORMAL LOW (ref 6.5–8.1)

## 2019-06-11 LAB — CBC WITH DIFFERENTIAL/PLATELET
Abs Immature Granulocytes: 0.07 10*3/uL (ref 0.00–0.07)
Basophils Absolute: 0 10*3/uL (ref 0.0–0.1)
Basophils Relative: 0 %
Eosinophils Absolute: 0.2 10*3/uL (ref 0.0–0.5)
Eosinophils Relative: 2 %
HCT: 41.7 % (ref 39.0–52.0)
Hemoglobin: 12.3 g/dL — ABNORMAL LOW (ref 13.0–17.0)
Immature Granulocytes: 1 %
Lymphocytes Relative: 9 %
Lymphs Abs: 1 10*3/uL (ref 0.7–4.0)
MCH: 31.9 pg (ref 26.0–34.0)
MCHC: 29.5 g/dL — ABNORMAL LOW (ref 30.0–36.0)
MCV: 108 fL — ABNORMAL HIGH (ref 80.0–100.0)
Monocytes Absolute: 1 10*3/uL (ref 0.1–1.0)
Monocytes Relative: 10 %
Neutro Abs: 8 10*3/uL — ABNORMAL HIGH (ref 1.7–7.7)
Neutrophils Relative %: 78 %
Platelets: 212 10*3/uL (ref 150–400)
RBC: 3.86 MIL/uL — ABNORMAL LOW (ref 4.22–5.81)
RDW: 14.2 % (ref 11.5–15.5)
WBC: 10.2 10*3/uL (ref 4.0–10.5)
nRBC: 0 % (ref 0.0–0.2)

## 2019-06-11 LAB — C-REACTIVE PROTEIN: CRP: 13.3 mg/dL — ABNORMAL HIGH (ref <1.0)

## 2019-06-11 LAB — MAGNESIUM: Magnesium: 1.9 mg/dL (ref 1.7–2.4)

## 2019-06-11 LAB — FERRITIN: Ferritin: 370 ng/mL — ABNORMAL HIGH (ref 24–336)

## 2019-06-11 LAB — INTERLEUKIN-6, PLASMA

## 2019-06-11 LAB — D-DIMER, QUANTITATIVE: D-Dimer, Quant: 2.13 ug/mL-FEU — ABNORMAL HIGH (ref 0.00–0.50)

## 2019-06-11 LAB — CK: Total CK: 45 U/L — ABNORMAL LOW (ref 49–397)

## 2019-06-11 LAB — PHOSPHORUS: Phosphorus: 3.6 mg/dL (ref 2.5–4.6)

## 2019-06-11 MED ORDER — FUROSEMIDE 10 MG/ML IJ SOLN
40.0000 mg | Freq: Once | INTRAMUSCULAR | Status: AC
  Administered 2019-06-11: 40 mg via INTRAVENOUS
  Filled 2019-06-11: qty 4

## 2019-06-11 MED ORDER — OXYCODONE HCL 5 MG PO TABS: 5.0000 mg | ORAL_TABLET | Freq: Four times a day (QID) | ORAL | Status: DC

## 2019-06-11 MED ORDER — ENOXAPARIN SODIUM 40 MG/0.4ML ~~LOC~~ SOLN
40.0000 mg | Freq: Two times a day (BID) | SUBCUTANEOUS | Status: DC
  Administered 2019-06-11 – 2019-06-14 (×6): 40 mg via SUBCUTANEOUS
  Filled 2019-06-11 (×6): qty 0.4

## 2019-06-11 MED ORDER — OXYCODONE HCL 5 MG PO TABS
5.0000 mg | ORAL_TABLET | Freq: Four times a day (QID) | ORAL | Status: DC
  Administered 2019-06-11 – 2019-06-13 (×8): 5 mg via ORAL
  Filled 2019-06-11 (×8): qty 1

## 2019-06-11 NOTE — Progress Notes (Signed)
PROGRESS NOTE    Michael Clay  MOQ:[id number] DOB: 1971-09-29 DOA: 06/05/2019 PCP: Patient, No Pcp Per   Brief Narrative:  Michael Clay is a 48 y.o.  Hispanic male PMHx ETOH abuse    Presenting with worsening SOB in the setting of COVID.  He has not received care through Epic prior to an initial Urgent Care visit on 7/25.  At that time, he reported subjective fever starting 5 days prior (about 7/20).  He was afebrile at that visit and was tested for COVID.   He was notified on 7/30 that his COVID test was positive.  He called back yesterday with c/o persistent cough and was called in [place].  He called back today and reported SOB with O2 sats 69% at home.  EMS was called and sats were 66% with patient walking around his house, improved to 91% on 15L NRB. Denies significant cough.   ED Course: Diagnosed with COVID 3 days ago.  Worsening SOB x 2 days.  62% with EMS while ambulating. Difficulty getting sats up.  Placed on 15L NRB to low 90s, dropped to 80s.  Plan for high flow [place] but can't do that at [facility].  Placed on [place] O2 and also NRB and now finally with enough O2, improved WOB.  Troponin 100, CTA ordered to determine heparin or not.  May need PCCM consult.  Subjective: No acute event.  On 50% FiO2.  Awake on propofol and fentanyl.  Gets agitated when off propofol.   Assessment & Plan:   Principal Problem:   Respiratory tract infection due to COVID-19 virus Active Problems:   COVID-19   Tension pneumothorax, spontaneous   Acute respiratory failure with hypoxia (HCC)   Pneumonia due to COVID-19 virus   Pneumothorax on right   CAP (community acquired pneumonia)   ETOH abuse  Acute respiratory failure with hypoxia/COVID pneumonia Recent Labs  Lab 06/07/19 0706 06/07/19 1750 06/08/19 0213 06/10/19 1215 06/11/19 0647  CRP 19.4* 15.6* 12.1* 7.5* 13.3*   Recent Labs  Lab 06/11/19 0647  DDIMER 2.13*  -Decadron 6 mg daily -Remdesivir per pharmacy protocol - Low dose Lovenox  based on d-dimer -After discussing patient's case decided not to proceed with Actemra although patient meets guidelines secondary to his continued improvement. -Patient had received antibiotics prior to admission to [facility] see below  Acute RIGHT pneumothorax -Intubated 8/8.  Reposition on 8/9 for recurrent pneumothorax -Negative water leak - Chest tube suction 20 cm H2O; once pneumothorax resolved and patient titrated to waterseal will remove chest tube - Daily PCXR - Daily ABG  CAP -Patient started on antibiotics for CAP prior to admission, complete course -  EtOH abuse  - 8/7 urine tox screen negative - EtOH screen not obtained - Folic acid 1 mg daily - In 100 mg daily     DVT prophylaxis: Lovenox BID Code Status: Full Family Communication: 8/11; contacted wife Ms. Michael Clay who only speaks a little English requested that I contact her in the morning with a translator in order to update her on the plan of care for her husband. Disposition Plan: TBD   Consultants:  PCCM  Procedures/Significant Events:  06/07/2019 intubation #7.5 cuffed 06/07/2019 right chest tube insertion 06/08/2019 chest tube repositioned   I have personally reviewed and interpreted all radiology studies and my findings are as above.  VENTILATOR SETTINGS: Vent mode; PRVC Vt Set: 440 Set rate: 28 FiO2: 60% I time: 0.75 PEEP: 5   Cultures 7/25 SARS CoV2> positive  8/6 CTA on hold due to tenuous pulmonary status 06/07/2019 sputum>> 06/05/2019 urine culture negative 06/05/2019 blood cultures x2>>      Antimicrobials: Anti-infectives (From admission, onward)   Start     Stop   06/06/19 2000  remdesivir 100 mg in sodium chloride 0.9 % 250 mL IVPB     06/09/19 2020   06/06/19 1400  azithromycin (ZITHROMAX) 500 mg in sodium chloride 0.9 % 250 mL IVPB     06/09/19 1625   06/06/19 1200  cefTRIAXone (ROCEPHIN) 1 g in sodium chloride 0.9 % 100 mL IVPB     06/11/19 2359   06/05/19 2000   remdesivir 200 mg in sodium chloride 0.9 % 250 mL IVPB     06/05/19 2138   06/05/19 1400  cefTRIAXone (ROCEPHIN) 1 g in sodium chloride 0.9 % 100 mL IVPB     06/05/19 1503   06/05/19 1400  azithromycin (ZITHROMAX) 500 mg in sodium chloride 0.9 % 250 mL IVPB     06/05/19 1503       Devices   LINES / TUBES:  #7.5 cuffed ETT 8/8>> RIGHT chest tube insertion 8/8>>      Continuous Infusions:  sodium chloride Stopped (06/07/19 1658)   sodium chloride 10 mL/hr at 06/11/19 1800   feeding supplement (VITAL 1.5 CAL) 35 mL/hr at 06/10/19 1800   fentaNYL infusion INTRAVENOUS 175 mcg/hr (06/11/19 1800)   propofol (DIPRIVAN) infusion 25 mcg/kg/min (06/11/19 1800)     Objective: Vitals:   06/11/19 1508 06/11/19 1600 06/11/19 1700 06/11/19 1800  BP: (!) 109/55 103/63 103/71 99/65  Pulse: 97 92 87 90  Resp: (!) 28 (!) 21 (!) 28 (!) 28  Temp:  99.5 F (37.5 C)    TempSrc:  Axillary    SpO2: 95% 96% 96% 96%  Weight:      Height:        Intake/Output Summary (Last 24 hours) at 06/11/2019 1858 Last data filed at 06/11/2019 1800 Gross per 24 hour  Intake 1742.49 ml  Output 4985 ml  Net -3242.51 ml   Filed Weights   06/07/19 0438 06/08/19 0500 06/10/19 0500  Weight: 68.3 kg 69.7 kg 76.5 kg    Examination:  General: Alert, follows commands, positive acute respiratory distress Eyes: negative scleral hemorrhage, negative anisocoria, negative icterus ENT: Negative Runny nose, negative gingival bleeding, # 7.5 ETT in place negative signs of bleeding or infection Neck:  Negative scars, masses, torticollis, lymphadenopathy, JVD Lungs: Clear to auscultation bilaterally without wheezes or crackles Cardiovascular: Regular rate and rhythm without murmur gallop or rub normal S1 and S2 Abdomen: negative abdominal pain, nondistended, positive soft, bowel sounds, no rebound, no ascites, no appreciable mass Extremities: No significant cyanosis, clubbing, or edema bilateral lower  extremities Skin: Negative rashes, lesions, ulcers Psychiatric:  Negative depression, negative anxiety, negative fatigue, negative mania  Central nervous system:  Cranial nerves II through XII intact, tongue/uvula midline, all extremities muscle strength 5/5, sensation intact throughout, negative dysarthria, negative expressive aphasia, negative receptive aphasia.  .     Data Reviewed: Care during the described time interval was provided by me .  I have reviewed this patient's available data, including medical history, events of note, physical examination, and all test results as part of my evaluation.   CBC: Recent Labs  Lab 06/06/19 0513  06/07/19 0706  06/08/19 0213  06/09/19 0337  06/10/19 0334 06/10/19 0350 06/11/19 0337 06/11/19 0647 06/11/19 0925  WBC 12.9*  --  22.3*  --  19.0*  --  7.0  --   --  10.5  --  10.2  --   NEUTROABS 10.3*  --  16.8*  --  16.3*  --  5.7  --   --   --   --  8.0*  --   HGB 14.7   < > 17.7*   < > 15.1   16.0   < > 12.0*   < > 13.6 13.1 12.2* 12.3* 12.9*  HCT 45.0   < > 55.4*   < > 48.6   47.0   < > 38.1*   < > 40.0 43.5 36.0* 41.7 38.0*  MCV 98.9  --  102.0*  --  105.0*  --  104.4*  --   --  106.6*  --  108.0*  --   PLT 241  --  422*  --  371  --  191  --   --  222  --  212  --    < > = values in this interval not displayed.   Basic Metabolic Panel: Recent Labs  Lab 06/08/19 0213  06/09/19 0337  06/10/19 0350 06/10/19 1215 06/11/19 0337 06/11/19 0647 06/11/19 0925  NA 140   143   < > 146*   < > 144 145 143 146* 146*  K 4.8   4.8   < > 3.6   < > 4.5 4.6 5.1 4.0 4.1  CL 105  --  105  --  105 108  --  103  --   CO2 22  --  32  --  29 29  --  36*  --   GLUCOSE 251*  --  175*  --  104* 128*  --  89  --   BUN 42*  --  19  --  28* 30*  --  33*  --   CREATININE 1.28*  --  0.64  --  0.69 0.55*  --  0.62  --   CALCIUM 7.7*  --  7.2*  --  7.8* 7.7*  --  7.9*  --   MG 2.6*  --  2.3  --  2.2 2.0  --  1.9  --   PHOS 6.0*  --  2.1*  --  4.3 4.1  --   3.6  --    < > = values in this interval not displayed.   GFR: Estimated Creatinine Clearance: 100.2 mL/min (by C-G formula based on SCr of 0.62 mg/dL). Liver Function Tests: Recent Labs  Lab 06/08/19 0213 06/09/19 0337 06/10/19 0350 06/10/19 1215 06/11/19 0647  AST 36 46* 161* 174* 78*  ALT 39 32 70* 72* 62*  ALKPHOS 83 59 79 78 76  BILITOT 0.9 0.5 0.5 0.8 0.6  PROT 5.9* 4.7* 5.8* 5.4* 5.6*  ALBUMIN 2.0* 1.6* 2.2* 2.0* 2.0*   No results for input(s): LIPASE, AMYLASE in the last 168 hours. No results for input(s): AMMONIA in the last 168 hours. Coagulation Profile: Recent Labs  Lab 06/05/19 1306  INR 1.3*   Cardiac Enzymes: Recent Labs  Lab 06/10/19 1215 06/11/19 0647  CKTOTAL 92 45*   BNP (last 3 results) No results for input(s): PROBNP in the last 8760 hours. HbA1C: No results for input(s): HGBA1C in the last 72 hours. CBG: Recent Labs  Lab 06/11/19 0013 06/11/19 0459 06/11/19 0811 06/11/19 1108 06/11/19 1621  GLUCAP 107* 91 96 129* 125*   Lipid Profile: Recent Labs    06/10/19 0350  TRIG 99   Thyroid Function Tests: No results  for input(s): TSH, T4TOTAL, FREET4, T3FREE, THYROIDAB in the last 72 hours. Anemia Panel: Recent Labs    06/10/19 1215 06/11/19 0647  FERRITIN 465* 370*   Urine analysis:    Component Value Date/Time   COLORURINE YELLOW 06/05/2019 1459   APPEARANCEUR CLEAR 06/05/2019 1459   LABSPEC 1.011 06/05/2019 1459   PHURINE 6.0 06/05/2019 1459   GLUCOSEU NEGATIVE 06/05/2019 1459   HGBUR NEGATIVE 06/05/2019 1459   BILIRUBINUR NEGATIVE 06/05/2019 1459   KETONESUR 5 (A) 06/05/2019 1459   PROTEINUR NEGATIVE 06/05/2019 1459   NITRITE NEGATIVE 06/05/2019 1459   LEUKOCYTESUR NEGATIVE 06/05/2019 1459   Sepsis Labs: '@LABRCNTIP' (procalcitonin:4,lacticidven:4)  ) Recent Results (from the past 240 hour(s))  Blood Culture (routine x 2)     Status: None   Collection Time: 06/05/19  1:30 PM   Specimen: BLOOD  Result Value Ref Range  Status   Specimen Description BLOOD RIGHT ANTECUBITAL  Final   Special Requests   Final    BOTTLES DRAWN AEROBIC AND ANAEROBIC Blood Culture results may not be optimal due to an inadequate volume of blood received in culture bottles   Culture   Final    NO GROWTH 5 DAYS Performed at [facility], [place] [address] [place], [place] [address]    Report Status 06/10/2019 FINAL  Final  Blood Culture (routine x 2)     Status: None   Collection Time: 06/05/19  1:54 PM   Specimen: BLOOD LEFT HAND  Result Value Ref Range Status   Specimen Description BLOOD LEFT HAND  Final   Special Requests   Final    BOTTLES DRAWN AEROBIC AND ANAEROBIC Blood Culture results may not be optimal due to an inadequate volume of blood received in culture bottles   Culture   Final    NO GROWTH 5 DAYS Performed at [facility], [place] [address], [place], [place] [address]    Report Status 06/10/2019 FINAL  Final  Urine culture     Status: None   Collection Time: 06/05/19  2:59 PM   Specimen: In/Out Cath Urine  Result Value Ref Range Status   Specimen Description IN/OUT CATH URINE  Final   Special Requests NONE  Final   Culture   Final    NO GROWTH Performed at [facility], [address] [place], [place] [address]    Report Status 06/06/2019 FINAL  Final  MRSA PCR Screening     Status: None   Collection Time: 06/09/19  3:50 PM   Specimen: Nasopharyngeal  Result Value Ref Range Status   MRSA by PCR NEGATIVE NEGATIVE Final    Comment:        The GeneXpert MRSA Assay (FDA approved for NASAL specimens only), is one component of a comprehensive MRSA colonization surveillance program. It is not intended to diagnose MRSA infection nor to guide or monitor treatment for MRSA infections. Performed at [facility], [place] [address], [place], [place] [address]   Culture, respiratory (non-expectorated)     Status: None (Preliminary result)   Collection Time: 06/10/19  5:09 AM   Specimen:  Tracheal Aspirate; Respiratory  Result Value Ref Range Status   Specimen Description   Final    TRACHEAL ASPIRATE Performed at [place] [address], [place], [place] [address]    Special Requests   Final    Normal Performed at [facility], [place] [address] [place],  [address]    Gram Stain  Final    FEW WBC PRESENT, PREDOMINANTLY PMN FEW GRAM POSITIVE COCCI IN PAIRS IN CLUSTERS RARE GRAM NEGATIVE RODS    Culture   Final    FEW STAPHYLOCOCCUS SPECIES (COAGULASE NEGATIVE) CALL MICROBIOLOGY LAB IF SENSITIVITIES ARE REQUIRED. Performed at [facility], [place] [address], [place], [place] [address]    Report Status PENDING  Incomplete         Radiology Studies: Dg Chest Port 1 View  Result Date: 06/11/2019 CLINICAL DATA:  Chest tube EXAM: PORTABLE CHEST 1 VIEW COMPARISON:  Yesterday FINDINGS: Endotracheal tube tip between the clavicular heads and carina. The orogastric tube tip at least reaches the distal stomach. Right IJ line with tip at the SVC. Right chest tube in stable lateral position. ARDS/viral pneumonia appearance. Stable inflation. No visible pneumothorax. Stable heart size. IMPRESSION: Stable hardware positioning and airspace disease. No visible pneumothorax. Electronically Signed   By: Monte Fantasia M.D.   On: 06/11/2019 08:57   Dg Chest Port 1 View  Result Date: 06/10/2019 CLINICAL DATA:  Endotracheal tube placement. EXAM: PORTABLE CHEST 1 VIEW COMPARISON:  Radiograph of June 09, 2019. FINDINGS: Stable cardiomediastinal silhouette. Stable position of endotracheal and nasogastric tubes. Stable right internal jugular catheter is noted. Stable position of right-sided chest tube. No definite pneumothorax is noted. Stable bilateral lung opacities are noted consistent with multifocal pneumonia. No significant pleural effusion is noted. Bony thorax is unremarkable. IMPRESSION: Stable support apparatus. Stable position of  right-sided chest tube without definite pneumothorax. Stable bilateral lung opacities as described above. Electronically Signed   By: Marijo Conception M.D.   On: 06/10/2019 08:56        Scheduled Meds:  chlorhexidine gluconate (MEDLINE KIT)  15 mL Mouth Rinse BID   Chlorhexidine Gluconate Cloth  6 each Topical Daily   dexamethasone (DECADRON) injection  6 mg Intravenous Q24H   docusate  100 mg Per Tube BID   enoxaparin (LOVENOX) injection  40 mg Subcutaneous Q12H   feeding supplement (PRO-STAT SUGAR FREE 64)  30 mL Per Tube TID   folic acid  1 mg Intravenous Daily   insulin aspart  0-9 Units Subcutaneous Q4H   ipratropium-albuterol  3 mL Nebulization TID   mouth rinse  15 mL Mouth Rinse 10 times per day   multivitamin  15 mL Per Tube Daily   oxyCODONE  5 mg Oral Q6H   pantoprazole (PROTONIX) IV  40 mg Intravenous Daily   sodium chloride flush  3 mL Intravenous Q12H   sodium chloride flush  3 mL Intravenous Q12H   thiamine injection  100 mg Intravenous Daily   Continuous Infusions:  sodium chloride Stopped (06/07/19 1658)   sodium chloride 10 mL/hr at 06/11/19 1800   feeding supplement (VITAL 1.5 CAL) 35 mL/hr at 06/10/19 1800   fentaNYL infusion INTRAVENOUS 175 mcg/hr (06/11/19 1800)   propofol (DIPRIVAN) infusion 25 mcg/kg/min (06/11/19 1800)     LOS: 6 days   The patient is critically ill with multiple organ systems failure and requires high complexity decision making for assessment and support, frequent evaluation and titration of therapies, application of advanced monitoring technologies and extensive interpretation of multiple databases. Critical Care Time devoted to patient care services described in this note  Time spent: 40 minutes     Berle Mull, MD [facility]  If 7PM-7AM, please contact night-coverage [email or web address] Password [facility] 06/11/2019, 6:58 PM

## 2019-06-11 NOTE — Progress Notes (Signed)
NAME:  Michael Clay, MRN:  [id number], DOB:  02-18-1971, LOS: 6 ADMISSION DATE:  06/05/2019, CONSULTATION DATE:  06/05/2019 REFERRING MD:  [facility] , CHIEF COMPLAINT:  SOB  Brief History   [name] COVID-19 +, exhibiting increasing oxygen requirements in ED. Now on NRB.   History of present illness   [name] known COVID-19 positive from community, no known significant medical history who presents to ED with worsening SOB x 2 days. EMS dispatched, patient SpO2 62%. Placed on 15L NRB with SpO2 recovery high 80s-low 90s.Troponin 100 in ED, CTA ordered by admitting hospitalist.  In ED, patient placed on [place] + NRB due to SpO2 in EDs. PCCM consulted to evaluate patient for possible intubation. Upon PCCM evaluation in ED, patient without respiratory distress and appears comfortable. We instruct patient to self-prone on current oxygen therapies and SpO2 recovered to 95%.   Past Medical History  No known [facility] Events   8/6> admit to [facility] under hospitalist service   Consults:  PCCM   Procedures:  06/07/2019 intubation 06/07/2019 right chest tube insertion 06/08/2019 chest tube repositioned  Significant Diagnostic Tests:  8/6 CXR> Diffuse bilateral pulmonary infiltrates   Micro Data:  7/25 SARS CoV2> positive  8/6 CTA on hold due to tenuous pulmonary status 06/07/2019 sputum>> 06/05/2019 urine culture negative 06/05/2019 blood cultures x2>>  Antimicrobials:  8/6 Azithromycin>>>8/10 8/6 Rocephin>>>8/12  Interim history/subjective:   Weaned to FIO2 50% and PEEP 8. Tolerating diuresis  Objective   Blood pressure 99/65, pulse 90, temperature 99.5 F (37.5 C), temperature source Axillary, resp. rate (!) 28, height 5\' 2"  (1.575 m), weight 76.5 kg, SpO2 96 %. CVP:  [7 mmHg] 7 mmHg  Vent Mode: PRVC FiO2 (%):  [50 %-70 %] 50 % Set Rate:  [28 bmp] 28 bmp Vt Set:  [440 mL] 440 mL PEEP:  [8 cmH20] 8 cmH20 Plateau Pressure:  [16 cmH20-21 cmH20] 16 cmH20   Intake/Output Summary  (Last 24 hours) at 06/11/2019 1922 Last data filed at 06/11/2019 1800 Gross per 24 hour  Intake 1742.49 ml  Output 4985 ml  Net -3242.51 ml   Filed Weights   06/07/19 0438 06/08/19 0500 06/10/19 0500  Weight: 68.3 kg 69.7 kg 76.5 kg   Physical Exam: General: Critically ill-appearing, no acute distress HENT: [place], AT, ETT in place Eyes: EOMI, no scleral icterus Respiratory: Clear to auscultation bilaterally. Right chest tube in place with no evidence of airleak Cardiovascular: RRR, -M/R/G, no JVD GI: BS+, soft, nontender Extremities:-Edema,-tenderness Neuro: Awake, follows commands GU: Foley in place  Resolved Hospital Problem list     Assessment & Plan:  ARDS due to COVID-19 pneumonia: C/b right PTX S/p remdesivir. Did not receive Actemra prior to transfer to [facility] however will defer at this time due to improving oxygenation and CRP <10 at time of transfer. Plan Continue mechanical ventilation per ARDS protocol Target TVol 6-8cc/kgIBW Target Plateau Pressure < 30cm H20 Target driving pressure less than 15 cm of water Target PaO2 55-65: titrate PEEP/FiO2 per protocol Ventilator associated pneumonia prevention protocol No further indication to prone due to improved oxygenation Lasix BID today Consider SBT tomorrow and potentially extubate  Right pneumothorax s/p chest tube Remain on suction until extubated Follow-up AM CXR  Agitation requiring sedation: Reported to be heavy drinker Continue thiamine and folic acid Consider adding Precedex to facilitate weaning of propofol and fentanyl  Best practice:  Diet: npo Pain/Anxiety/Delirium protocol (if indicated): not indicated VAP protocol (if indicated):  not indicated DVT prophylaxis: Reduce to prophylactic lovenox due to bleeding GI prophylaxis: not indicated Glucose control: ssi Mobility: Bedrest with prone protocol Code Status: FULL Family Communication: Per TRH Disposition: [facility] ICU   Labs   CBC: Recent Labs  Lab  06/05/19 1306 06/05/19 1316  WBC 12.4*  --   NEUTROABS 9.9*  --   HGB 15.0 15.3  HCT 44.9 45.0  MCV 98.9  --   PLT 248  --     Recent Labs  Lab 06/10/19 0350 06/10/19 1215 06/11/19 0337 06/11/19 0647 06/11/19 0925  NA 144 145 143 146* 146*  K 4.5 4.6 5.1 4.0 4.1  CL 105 108  --  103  --   CO2 29 29  --  36*  --   BUN 28* 30*  --  33*  --   CREATININE 0.69 0.55*  --  0.62  --   GLUCOSE 104* 128*  --  89  --    Recent Labs  Lab 06/09/19 0337  06/10/19 0350 06/11/19 0337 06/11/19 0647 06/11/19 0925  HGB 12.0*   < > 13.1 12.2* 12.3* 12.9*  HCT 38.1*   < > 43.5 36.0* 41.7 38.0*  WBC 7.0  --  10.5  --  10.2  --   PLT 191  --  222  --  212  --    < > = values in this interval not displayed.     No results for input(s): LIPASE, AMYLASE in the last 168 hours. No results for input(s): AMMONIA in the last 168 hours.  ABG    Component Value Date/Time   PHART 7.432 06/11/2019 0925   PCO2ART 56.8 (H) 06/11/2019 0925   PO2ART 50.0 (L) 06/11/2019 0925   HCO3 37.9 (H) 06/11/2019 0925   TCO2 40 (H) 06/11/2019 0925   ACIDBASEDEF 2.0 06/08/2019 0213   O2SAT 85.0 06/11/2019 0925   Coagulation Profile: Lab Results  Component Value Date   INR 1.3 (H) 06/05/2019    Cardiac Enzymes: No results for input(s): CKTOTAL, CKMB, CKMBINDEX, TROPONINI in the last 168 hours.  HbA1C: No results found for: HGBA1C  CBG: CBG (last 3)  Recent Labs    06/11/19 0811 06/11/19 1108 06/11/19 1621  GLUCAP 96 129* 125*   COVID-19 Labs  Recent Labs    06/10/19 0350 06/10/19 1215 06/11/19 0647  DDIMER  --  2.78* 2.13*  FERRITIN 532* 465* 370*  CRP  --  7.5* 13.3*    Lab Results  Component Value Date   SARSCOV2NAA DETECTED (A) 05/24/2019   Critical Care: 35 minutes  The patient is critically ill with multiple organ systems failure and requires high complexity decision making for assessment and support, frequent evaluation and titration of therapies, application of advanced  monitoring technologies and extensive interpretation of multiple databases.   Discussed and co-managed patient care with PCCM-Hospitalist. Coordinated care with RT, RN and pharmacist.  Mechele CollinJane Kimmy Totten, M.D. [facility] 06/11/2019 7:22 PM  Pager: [phone] After hours pager: [phone]

## 2019-06-12 DIAGNOSIS — J988 Other specified respiratory disorders: Secondary | ICD-10-CM | POA: Diagnosis not present

## 2019-06-12 DIAGNOSIS — U071 COVID-19: Secondary | ICD-10-CM | POA: Diagnosis not present

## 2019-06-12 LAB — CBC WITH DIFFERENTIAL/PLATELET
Abs Immature Granulocytes: 0.06 10*3/uL (ref 0.00–0.07)
Basophils Absolute: 0 10*3/uL (ref 0.0–0.1)
Basophils Relative: 0 %
Eosinophils Absolute: 0.2 10*3/uL (ref 0.0–0.5)
Eosinophils Relative: 3 %
HCT: 40.2 % (ref 39.0–52.0)
Hemoglobin: 11.9 g/dL — ABNORMAL LOW (ref 13.0–17.0)
Immature Granulocytes: 1 %
Lymphocytes Relative: 15 %
Lymphs Abs: 1.2 10*3/uL (ref 0.7–4.0)
MCH: 32.1 pg (ref 26.0–34.0)
MCHC: 29.6 g/dL — ABNORMAL LOW (ref 30.0–36.0)
MCV: 108.4 fL — ABNORMAL HIGH (ref 80.0–100.0)
Monocytes Absolute: 1.1 10*3/uL — ABNORMAL HIGH (ref 0.1–1.0)
Monocytes Relative: 13 %
Neutro Abs: 5.8 10*3/uL (ref 1.7–7.7)
Neutrophils Relative %: 68 %
Platelets: 215 10*3/uL (ref 150–400)
RBC: 3.71 MIL/uL — ABNORMAL LOW (ref 4.22–5.81)
RDW: 14.1 % (ref 11.5–15.5)
WBC: 8.4 10*3/uL (ref 4.0–10.5)
nRBC: 0 % (ref 0.0–0.2)

## 2019-06-12 LAB — POCT I-STAT 7, (LYTES, BLD GAS, ICA,H+H)
Acid-Base Excess: 8 mmol/L — ABNORMAL HIGH (ref 0.0–2.0)
Acid-Base Excess: 3 mmol/L — ABNORMAL HIGH (ref 0.0–2.0)
Bicarbonate: 34.3 mmol/L — ABNORMAL HIGH (ref 20.0–28.0)
Bicarbonate: 28 mmol/L (ref 20.0–28.0)
Calcium, Ion: 1.03 mmol/L — ABNORMAL LOW (ref 1.15–1.40)
Calcium, Ion: 1.03 mmol/L — ABNORMAL LOW (ref 1.15–1.40)
HCT: 40 % (ref 39.0–52.0)
HCT: 31 % — ABNORMAL LOW (ref 39.0–52.0)
Hemoglobin: 13.6 g/dL (ref 13.0–17.0)
Hemoglobin: 10.5 g/dL — ABNORMAL LOW (ref 13.0–17.0)
O2 Saturation: 94 %
O2 Saturation: 87 %
Patient temperature: 99.5
Patient temperature: 98.4
Potassium: 7 mmol/L (ref 3.5–5.1)
Potassium: 3 mmol/L — ABNORMAL LOW (ref 3.5–5.1)
Sodium: 142 mmol/L (ref 135–145)
Sodium: 150 mmol/L — ABNORMAL HIGH (ref 135–145)
TCO2: 36 mmol/L — ABNORMAL HIGH (ref 22–32)
TCO2: 29 mmol/L (ref 22–32)
pCO2 arterial: 53.5 mmHg — ABNORMAL HIGH (ref 32.0–48.0)
pCO2 arterial: 41.4 mmHg (ref 32.0–48.0)
pH, Arterial: 7.417 (ref 7.350–7.450)
pH, Arterial: 7.439 (ref 7.350–7.450)
pO2, Arterial: 73 mmHg — ABNORMAL LOW (ref 83.0–108.0)
pO2, Arterial: 51 mmHg — ABNORMAL LOW (ref 83.0–108.0)

## 2019-06-12 LAB — COMPREHENSIVE METABOLIC PANEL
ALT: 49 U/L — ABNORMAL HIGH (ref 0–44)
AST: 48 U/L — ABNORMAL HIGH (ref 15–41)
Albumin: 2 g/dL — ABNORMAL LOW (ref 3.5–5.0)
Alkaline Phosphatase: 74 U/L (ref 38–126)
Anion gap: 8 (ref 5–15)
BUN: 32 mg/dL — ABNORMAL HIGH (ref 6–20)
CO2: 34 mmol/L — ABNORMAL HIGH (ref 22–32)
Calcium: 7.9 mg/dL — ABNORMAL LOW (ref 8.9–10.3)
Chloride: 102 mmol/L (ref 98–111)
Creatinine, Ser: 0.71 mg/dL (ref 0.61–1.24)
GFR calc Af Amer: >60 mL/min (ref >60)
GFR calc non Af Amer: >60 mL/min (ref >60)
Glucose, Bld: 107 mg/dL — ABNORMAL HIGH (ref 70–99)
Potassium: 4.2 mmol/L (ref 3.5–5.1)
Sodium: 144 mmol/L (ref 135–145)
Total Bilirubin: 0.9 mg/dL (ref 0.3–1.2)
Total Protein: 5.8 g/dL — ABNORMAL LOW (ref 6.5–8.1)

## 2019-06-12 LAB — CULTURE, RESPIRATORY W GRAM STAIN: Special Requests: NORMAL

## 2019-06-12 LAB — FERRITIN: Ferritin: 382 ng/mL — ABNORMAL HIGH (ref 24–336)

## 2019-06-12 LAB — GLUCOSE, CAPILLARY
Glucose-Capillary: 100 mg/dL — ABNORMAL HIGH (ref 70–99)
Glucose-Capillary: 111 mg/dL — ABNORMAL HIGH (ref 70–99)
Glucose-Capillary: 118 mg/dL — ABNORMAL HIGH (ref 70–99)
Glucose-Capillary: 121 mg/dL — ABNORMAL HIGH (ref 70–99)
Glucose-Capillary: 154 mg/dL — ABNORMAL HIGH (ref 70–99)

## 2019-06-12 LAB — TRIGLYCERIDES: Triglycerides: 124 mg/dL (ref <150)

## 2019-06-12 LAB — MAGNESIUM: Magnesium: 2.2 mg/dL (ref 1.7–2.4)

## 2019-06-12 LAB — PHOSPHORUS: Phosphorus: 3.8 mg/dL (ref 2.5–4.6)

## 2019-06-12 LAB — D-DIMER, QUANTITATIVE: D-Dimer, Quant: 2 ug/mL-FEU — ABNORMAL HIGH (ref 0.00–0.50)

## 2019-06-12 LAB — CK: Total CK: 27 U/L — ABNORMAL LOW (ref 49–397)

## 2019-06-12 LAB — C-REACTIVE PROTEIN: CRP: 13.9 mg/dL — ABNORMAL HIGH (ref <1.0)

## 2019-06-12 MED ORDER — FUROSEMIDE 10 MG/ML IJ SOLN: 40.0000 mg | Freq: Every day | INTRAMUSCULAR | Status: DC

## 2019-06-12 MED ORDER — POLYETHYLENE GLYCOL 3350 17 G PO PACK
17.0000 g | PACK | Freq: Two times a day (BID) | ORAL | Status: DC
  Administered 2019-06-12 – 2019-06-13 (×3): 17 g
  Filled 2019-06-12 (×3): qty 1

## 2019-06-12 MED ORDER — POLYETHYLENE GLYCOL 3350 17 G PO PACK: 17.0000 g | PACK | Freq: Two times a day (BID) | ORAL | Status: DC

## 2019-06-12 MED ORDER — VITAMIN B-1 100 MG PO TABS
100.0000 mg | ORAL_TABLET | Freq: Every day | ORAL | Status: DC
  Administered 2019-06-12 – 2019-06-13 (×2): 100 mg
  Filled 2019-06-12 (×2): qty 1

## 2019-06-12 MED ORDER — PANTOPRAZOLE SODIUM 40 MG PO PACK
40.0000 mg | PACK | Freq: Every day | ORAL | Status: DC
  Administered 2019-06-12 – 2019-06-13 (×2): 40 mg
  Filled 2019-06-12 (×2): qty 20

## 2019-06-12 MED ORDER — FOLIC ACID 1 MG PO TABS
1.0000 mg | ORAL_TABLET | Freq: Every day | ORAL | Status: DC
  Administered 2019-06-12 – 2019-06-13 (×2): 1 mg
  Filled 2019-06-12 (×2): qty 1

## 2019-06-12 MED ORDER — POTASSIUM CHLORIDE 10 MEQ/50ML IV SOLN
10.0000 meq | INTRAVENOUS | Status: AC
  Administered 2019-06-12 (×6): 10 meq via INTRAVENOUS
  Filled 2019-06-12 (×6): qty 50

## 2019-06-12 MED ORDER — MAGNESIUM SULFATE 2 GM/50ML IV SOLN
2.0000 g | Freq: Once | INTRAVENOUS | Status: AC
  Administered 2019-06-12: 2 g via INTRAVENOUS
  Filled 2019-06-12: qty 50

## 2019-06-12 NOTE — Progress Notes (Signed)
Updated wife, Verdis Frederickson, via interpreter services. Assisted with video chat through [employer].

## 2019-06-12 NOTE — Progress Notes (Signed)
Nutrition Follow-up   RD working remotely.   DOCUMENTATION CODES:   Not applicable  INTERVENTION:   Tube Feeding:  Vital 1.5 at 40 ml/hr  Pro-Stat 30 mL TID Provides 1740 kcals, 110 g of protein 730 mL of free water  TF regimen and propofol at current rate providing 2012 total kcal/day (103 % of kcal needs)   NUTRITION DIAGNOSIS:   Inadequate oral intake related to inability to eat(pt sedated and ventilated) as evidenced by NPO status.  Being addressed via TF   GOAL:   Provide needs based on ASPEN/SCCM guidelines  Met  MONITOR:   Vent status, Labs, Weight trends, TF tolerance, Skin, I & O's  REASON FOR ASSESSMENT:   Consult    ASSESSMENT:   48 year old with h/o etoh abuse admitted with COVID-19 positive, COVID pneumonia requiring intubation now s/p R chest tube placement secondary to pneumothorax.   Patient is currently intubated on ventilator support MV: 12 L/min Temp (24hrs), Avg:99.5 F (37.5 C), Min:98.4 F (36.9 C), Max:101 F (38.3 C)  Propofol: 10.3 ml/hr  Vital 1.5 at 35 ml/hr, Pro-Stat 30 mL TID  Constipated; no BM since admission  Current wt 69.9 kg; admission wt 71.8 kg  Labs: reviewed Meds: decadron, folic acid, MVI, ss novolog, thiamine  Diet Order:   Diet Order            Diet NPO time specified  Diet effective now              EDUCATION NEEDS:   No education needs have been identified at this time  Skin:  Skin Assessment: Reviewed RN Assessment  Last BM:  no documented BM  Height:   Ht Readings from Last 1 Encounters:  06/09/19 5' 2" (1.575 m)    Weight:   Wt Readings from Last 1 Encounters:  06/12/19 69.9 kg    Ideal Body Weight:  53.6 kg  BMI:  Body mass index is 28.19 kg/m.  Estimated Nutritional Needs:   Kcal:  1945 kcals  Protein:  95-120 g  Fluid:  >1.7L/day   Kerman Passey MS, RDN, LDN, CNSC [phone] Pager  [phone] Weekend/On-Call Pager

## 2019-06-12 NOTE — Progress Notes (Signed)
PROGRESS NOTE    Michael Clay  GMW:[id number] DOB: [birth year] DOA: 06/05/2019 PCP: Patient, No Pcp Per   Brief Narrative:  Michael Clay is a 48 y.o.  Hispanic male PMHx ETOH abuse    Presenting with worsening SOB in the setting of COVID.  He has not received care through Epic prior to an initial Urgent Care visit on 7/25.  At that time, he reported subjective fever starting 5 days prior (about 7/20).  He was afebrile at that visit and was tested for COVID.   He was notified on 7/30 that his COVID test was positive.  He called back yesterday with c/o persistent cough and was called in [place].  He called back today and reported SOB with O2 sats 69% at home.  EMS was called and sats were 66% with patient walking around his house, improved to 91% on 15L NRB. Denies significant cough.   ED Course: Diagnosed with COVID 3 days ago.  Worsening SOB x 2 days.  62% with EMS while ambulating. Difficulty getting sats up.  Placed on 15L NRB to low 90s, dropped to 80s.  Plan for high flow [place] but can't do that at [facility].  Placed on [place] O2 and also NRB and now finally with enough O2, improved WOB.  Troponin 100, CTA ordered to determine heparin or not.  May need PCCM consult.  Subjective: Had low grade temp.   Assessment & Plan:   Principal Problem:   Respiratory tract infection due to COVID-19 virus Active Problems:   COVID-19   Tension pneumothorax, spontaneous   Acute respiratory failure with hypoxia (HCC)   Pneumonia due to COVID-19 virus   Pneumothorax on right   CAP (community acquired pneumonia)   ETOH abuse  Acute respiratory failure with hypoxia/COVID pneumonia Recent Labs  Lab 06/07/19 1750 06/08/19 0213 06/10/19 1215 06/11/19 0647 06/12/19 0519  CRP 15.6* 12.1* 7.5* 13.3* 13.9*   Recent Labs  Lab 06/12/19 0519  DDIMER 2.00*  -Decadron 6 mg daily -Remdesivir per pharmacy protocol completed - Low dose Lovenox based on d-dimer - After discussing patient's case decided not to  proceed with Actemra although patient meets guidelines secondary to his continued improvement. - Patient had received antibiotics prior to admission to [facility] see below  Acute RIGHT pneumothorax -Intubated 8/8.  Reposition on 8/9 for recurrent pneumothorax -Negative water leak - Chest tube suction 20 cm H2O; once pneumothorax resolved and patient titrated to waterseal will remove chest tube - Daily ABG  CAP -Patient started on antibiotics for CAP prior to admission, complete course  EtOH abuse  - 8/7 urine tox screen negative - EtOH screen not obtained - Folic acid 1 mg daily - In 100 mg daily -- May need precedex  DVT prophylaxis: Lovenox BID Code Status: Full Family Communication: 8/11 contacted wife Ms. Terryon Pineiro who only speaks a little English requested that I contact her in the morning with a translator in order to update her on the plan of care for her husband. Disposition Plan: TBD  Consultants:  PCCM  Procedures/Significant Events:  06/07/2019 intubation #7.5 cuffed 06/07/2019 right chest tube insertion 06/08/2019 chest tube repositioned  I have personally reviewed and interpreted all radiology studies and my findings are as above.  VENTILATOR SETTINGS: Vent Mode: PRVC FiO2 (%):  [40 %-60 %] 40 % Set Rate:  [28 bmp] 28 bmp Vt Set:  [440 mL] 440 mL PEEP:  [8 cmH20] 8 cmH20 Plateau Pressure:  [21 cmH20-25 cmH20] 24 cmH20  Cultures 7/25 SARS CoV2> positive  8/6 CTA on hold due to tenuous pulmonary status 06/07/2019 sputum>> 06/05/2019 urine culture negative 06/05/2019 blood cultures x2>>  Antimicrobials: Anti-infectives (From admission, onward)   Start     Stop   06/06/19 2000  remdesivir 100 mg in sodium chloride 0.9 % 250 mL IVPB     06/09/19 2020   06/06/19 1400  azithromycin (ZITHROMAX) 500 mg in sodium chloride 0.9 % 250 mL IVPB     06/09/19 1625   06/06/19 1200  cefTRIAXone (ROCEPHIN) 1 g in sodium chloride 0.9 % 100 mL IVPB     06/11/19 2359    06/05/19 2000  remdesivir 200 mg in sodium chloride 0.9 % 250 mL IVPB     06/05/19 2138   06/05/19 1400  cefTRIAXone (ROCEPHIN) 1 g in sodium chloride 0.9 % 100 mL IVPB     06/05/19 1503   06/05/19 1400  azithromycin (ZITHROMAX) 500 mg in sodium chloride 0.9 % 250 mL IVPB     06/05/19 1503       Devices   LINES / TUBES:  #7.5 cuffed ETT 8/8>> RIGHT chest tube insertion 8/8>>      Continuous Infusions: . sodium chloride Stopped (06/07/19 1658)  . sodium chloride 10 mL/hr at 06/12/19 1500  . feeding supplement (VITAL 1.5 CAL) 1,000 mL (06/12/19 1130)  . fentaNYL infusion INTRAVENOUS 150 mcg/hr (06/12/19 1500)  . propofol (DIPRIVAN) infusion 25 mcg/kg/min (06/12/19 1500)     Objective: Vitals:   06/12/19 1245 06/12/19 1300 06/12/19 1400 06/12/19 1500  BP: (!) 101/58 97/60 111/68 113/63  Pulse: 91 82 81 (!) 101  Resp: (!) 28 (!) 28 (!) 26 (!) 27  Temp:      TempSrc:      SpO2: 95% 90% 93% 92%  Weight:      Height:        Intake/Output Summary (Last 24 hours) at 06/12/2019 1549 Last data filed at 06/12/2019 1500 Gross per 24 hour  Intake 1935.51 ml  Output 2630 ml  Net -694.49 ml   Filed Weights   06/08/19 0500 06/10/19 0500 06/12/19 0500  Weight: 69.7 kg 76.5 kg 69.9 kg    Examination:  General: Alert, follows commands, positive acute respiratory distress Eyes: negative scleral hemorrhage, negative anisocoria, negative icterus ENT: Negative Runny nose, negative gingival bleeding, # 7.5 ETT in place negative signs of bleeding or infection Neck:  Negative scars, masses, torticollis, lymphadenopathy, JVD Lungs: Clear to auscultation bilaterally without wheezes or crackles Cardiovascular: Regular rate and rhythm without murmur gallop or rub normal S1 and S2 Abdomen: negative abdominal pain, nondistended, positive soft, bowel sounds, no rebound, no ascites, no appreciable mass Extremities: No significant cyanosis, clubbing, or edema bilateral lower extremities  Skin: Negative rashes, lesions, ulcers Psychiatric:  Negative depression, negative anxiety, negative fatigue, negative mania  Central nervous system:  Cranial nerves II through XII intact, tongue/uvula midline, all extremities muscle strength 5/5, sensation intact throughout, negative dysarthria, negative expressive aphasia, negative receptive aphasia.  Data Reviewed: Care during the described time interval was provided by me .  I have reviewed this patient's available data, including medical history, events of note, physical examination, and all test results as part of my evaluation.  CBC: Recent Labs  Lab 06/07/19 0706  06/08/19 0213  06/09/19 0337  06/10/19 0350 06/11/19 0337 06/11/19 0647 06/11/19 0925 06/12/19 0323 06/12/19 0519  WBC 22.3*  --  19.0*  --  7.0  --  10.5  --  10.2  --   --  8.4  NEUTROABS 16.8*  --  16.3*  --  5.7  --   --   --  8.0*  --   --  5.8  HGB 17.7*   < > 15.1  16.0   < > 12.0*   < > 13.1 12.2* 12.3* 12.9* 10.5* 11.9*  HCT 55.4*   < > 48.6  47.0   < > 38.1*   < > 43.5 36.0* 41.7 38.0* 31.0* 40.2  MCV 102.0*  --  105.0*  --  104.4*  --  106.6*  --  108.0*  --   --  108.4*  PLT 422*  --  371  --  191  --  222  --  212  --   --  215   < > = values in this interval not displayed.   Basic Metabolic Panel: Recent Labs  Lab 06/09/19 0337  06/10/19 0350 06/10/19 1215 06/11/19 0337 06/11/19 0647 06/11/19 0925 06/12/19 0323 06/12/19 0519  NA 146*   < > 144 145 143 146* 146* 150* 144  K 3.6   < > 4.5 4.6 5.1 4.0 4.1 3.0* 4.2  CL 105  --  105 108  --  103  --   --  102  CO2 32  --  29 29  --  36*  --   --  34*  GLUCOSE 175*  --  104* 128*  --  89  --   --  107*  BUN 19  --  28* 30*  --  33*  --   --  32*  CREATININE 0.64  --  0.69 0.55*  --  0.62  --   --  0.71  CALCIUM 7.2*  --  7.8* 7.7*  --  7.9*  --   --  7.9*  MG 2.3  --  2.2 2.0  --  1.9  --   --  2.2  PHOS 2.1*  --  4.3 4.1  --  3.6  --   --  3.8   < > = values in this interval not displayed.    GFR: Estimated Creatinine Clearance: 95.9 mL/min (by C-G formula based on SCr of 0.71 mg/dL). Liver Function Tests: Recent Labs  Lab 06/09/19 0337 06/10/19 0350 06/10/19 1215 06/11/19 0647 06/12/19 0519  AST 46* 161* 174* 78* 48*  ALT 32 70* 72* 62* 49*  ALKPHOS 59 79 78 76 74  BILITOT 0.5 0.5 0.8 0.6 0.9  PROT 4.7* 5.8* 5.4* 5.6* 5.8*  ALBUMIN 1.6* 2.2* 2.0* 2.0* 2.0*   No results for input(s): LIPASE, AMYLASE in the last 168 hours. No results for input(s): AMMONIA in the last 168 hours. Coagulation Profile: No results for input(s): INR, PROTIME in the last 168 hours. Cardiac Enzymes: Recent Labs  Lab 06/10/19 1215 06/11/19 0647 06/12/19 0519  CKTOTAL 92 45* 27*   BNP (last 3 results) No results for input(s): PROBNP in the last 8760 hours. HbA1C: No results for input(s): HGBA1C in the last 72 hours. CBG: Recent Labs  Lab 06/11/19 2341 06/12/19 0324 06/12/19 0826 06/12/19 1129 06/12/19 1518  GLUCAP 107* 100* 111* 118* 154*   Lipid Profile: Recent Labs    06/10/19 0350 06/12/19 0546  TRIG 99 124   Thyroid Function Tests: No results for input(s): TSH, T4TOTAL, FREET4, T3FREE, THYROIDAB in the last 72 hours. Anemia Panel: Recent Labs    06/11/19 0647 06/12/19 0519  FERRITIN 370* 382*   Urine analysis:    Component Value Date/Time  COLORURINE YELLOW 06/05/2019 [place] 06/05/2019 1459   LABSPEC 1.011 06/05/2019 1459   PHURINE 6.0 06/05/2019 1459   GLUCOSEU NEGATIVE 06/05/2019 1459   HGBUR NEGATIVE 06/05/2019 1459   BILIRUBINUR NEGATIVE 06/05/2019 1459   KETONESUR 5 (A) 06/05/2019 1459   PROTEINUR NEGATIVE 06/05/2019 1459   NITRITE NEGATIVE 06/05/2019 1459   LEUKOCYTESUR NEGATIVE 06/05/2019 1459   Sepsis Labs:  Recent Results (from the past 240 hour(s))  Blood Culture (routine x 2)     Status: None   Collection Time: 06/05/19  1:30 PM   Specimen: BLOOD  Result Value Ref Range Status   Specimen Description BLOOD RIGHT  ANTECUBITAL  Final   Special Requests   Final    BOTTLES DRAWN AEROBIC AND ANAEROBIC Blood Culture results may not be optimal due to an inadequate volume of blood received in culture bottles   Culture   Final    NO GROWTH 5 DAYS Performed at [facility], [place] [address], [place], [place] [address]    Report Status 06/10/2019 FINAL  Final  Blood Culture (routine x 2)     Status: None   Collection Time: 06/05/19  1:54 PM   Specimen: BLOOD LEFT HAND  Result Value Ref Range Status   Specimen Description BLOOD LEFT HAND  Final   Special Requests   Final    BOTTLES DRAWN AEROBIC AND ANAEROBIC Blood Culture results may not be optimal due to an inadequate volume of blood received in culture bottles   Culture   Final    NO GROWTH 5 DAYS Performed at [facility], [place] [address] [place], [place] [address]    Report Status 06/10/2019 FINAL  Final  Urine culture     Status: None   Collection Time: 06/05/19  2:59 PM   Specimen: In/Out Cath Urine  Result Value Ref Range Status   Specimen Description IN/OUT CATH URINE  Final   Special Requests NONE  Final   Culture   Final    NO GROWTH Performed at [facility], [place] [address] [place], [place] [address]    Report Status 06/06/2019 FINAL  Final  MRSA PCR Screening     Status: None   Collection Time: 06/09/19  3:50 PM   Specimen: Nasopharyngeal  Result Value Ref Range Status   MRSA by PCR NEGATIVE NEGATIVE Final    Comment:        The GeneXpert MRSA Assay (FDA approved for NASAL specimens only), is one component of a comprehensive MRSA colonization surveillance program. It is not intended to diagnose MRSA infection nor to guide or monitor treatment for MRSA infections. Performed at [facility], [place] [address] [place], [place] [address]   Culture, respiratory (non-expectorated)     Status: None   Collection Time: 06/10/19  5:09 AM   Specimen: Tracheal Aspirate; Respiratory  Result Value Ref Range Status    Specimen Description   Final    TRACHEAL ASPIRATE Performed at [facility] [address], [place],  [address]    Special Requests   Final    Normal Performed at [facility], [place] [address] [place], [place] [address]    Gram Stain   Final    FEW WBC PRESENT, PREDOMINANTLY PMN FEW GRAM POSITIVE COCCI IN PAIRS IN CLUSTERS RARE GRAM NEGATIVE RODS    Culture   Final    FEW STAPHYLOCOCCUS SPECIES (COAGULASE NEGATIVE) CALL MICROBIOLOGY LAB IF SENSITIVITIES ARE  REQUIRED. Performed at [facility], [place] [address] [place], [place] [address]    Report Status 06/12/2019 FINAL  Final    Radiology Studies: Dg Chest Port 1 View  Result Date: 06/11/2019 CLINICAL DATA:  Chest tube EXAM: PORTABLE CHEST 1 VIEW COMPARISON:  Yesterday FINDINGS: Endotracheal tube tip between the clavicular heads and carina. The orogastric tube tip at least reaches the distal stomach. Right IJ line with tip at the SVC. Right chest tube in stable lateral position. ARDS/viral pneumonia appearance. Stable inflation. No visible pneumothorax. Stable heart size. IMPRESSION: Stable hardware positioning and airspace disease. No visible pneumothorax. Electronically Signed   By: Monte Fantasia M.D.   On: 06/11/2019 08:57   Scheduled Meds: . chlorhexidine gluconate (MEDLINE KIT)  15 mL Mouth Rinse BID  . Chlorhexidine Gluconate Cloth  6 each Topical Daily  . dexamethasone (DECADRON) injection  6 mg Intravenous Q24H  . docusate  100 mg Per Tube BID  . enoxaparin (LOVENOX) injection  40 mg Subcutaneous Q12H  . feeding supplement (PRO-STAT SUGAR FREE 64)  30 mL Per Tube TID  . folic acid  1 mg Per Tube Daily  . insulin aspart  0-9 Units Subcutaneous Q4H  . ipratropium-albuterol  3 mL Nebulization TID  . mouth rinse  15 mL Mouth Rinse 10 times per day  . multivitamin  15 mL Per Tube Daily  . oxyCODONE  5 mg Oral Q6H  . pantoprazole sodium  40 mg Per Tube Daily  .  polyethylene glycol  17 g Per Tube BID  . sodium chloride flush  3 mL Intravenous Q12H  . sodium chloride flush  3 mL Intravenous Q12H  . thiamine  100 mg Per Tube Daily   Continuous Infusions: . sodium chloride Stopped (06/07/19 1658)  . sodium chloride 10 mL/hr at 06/12/19 1500  . feeding supplement (VITAL 1.5 CAL) 1,000 mL (06/12/19 1130)  . fentaNYL infusion INTRAVENOUS 150 mcg/hr (06/12/19 1500)  . propofol (DIPRIVAN) infusion 25 mcg/kg/min (06/12/19 1500)     LOS: 7 days   Time spent: 40 minutes  Berle Mull, MD [facility]  If 7PM-7AM, please contact night-coverage [email or web address] Password [facility] 06/12/2019, 3:49 PM

## 2019-06-12 NOTE — Progress Notes (Signed)
PHARMACIST - PHYSICIAN COMMUNICATION  DR:   Posey Pronto  CONCERNING: IV to Oral Route Change Policy RECOMMENDATION: This patient is receiving Folic acid, Thiamine, Protonix by the intravenous route.  Based on criteria approved by the Pharmacy and Therapeutics Committee, the intravenous medication(s) is/are being converted to the equivalent enteral dose form(s).   DESCRIPTION: These criteria include:  The patient is eating (either orally or via tube) and/or has been taking other orally administered medications for a least 24 hours  The patient has no evidence of active gastrointestinal bleeding or impaired GI absorption (gastrectomy, short bowel, patient on TNA or NPO).  If you have questions about this conversion, please contact the [place] PharmD, BCPS Clinical pharmacist phone 7am- 5pm: [phone] 06/12/2019 9:19 AM

## 2019-06-12 NOTE — Progress Notes (Signed)
NAME:  Michael Clay, MRN:  [id number], DOB:  07/28/1971, LOS: 7 ADMISSION DATE:  06/05/2019, CONSULTATION DATE:  06/05/2019 REFERRING MD:  Ophelia CharterYates - [facility] , CHIEF COMPLAINT:  SOB  Brief History   [name] COVID-19 +, exhibiting increasing oxygen requirements in ED. Now on NRB.   History of present illness   [name] known COVID-19 positive from community, no known significant medical history who presents to ED with worsening SOB x 2 days. EMS dispatched, patient SpO2 62%. Placed on 15L NRB with SpO2 recovery high 80s-low 90s.Troponin 100 in ED, CTA ordered by admitting hospitalist.  In ED, patient placed on [place] + NRB due to SpO2 in EDs. PCCM consulted to evaluate patient for possible intubation. Upon PCCM evaluation in ED, patient without respiratory distress and appears comfortable. We instruct patient to self-prone on current oxygen therapies and SpO2 recovered to 95%.   Past Medical History  No known PMH   Significant Hospital Events   [facility]   Consults:  PCCM   Procedures:  06/07/2019 intubation 06/07/2019 right chest tube insertion 06/08/2019 chest tube repositioned  Significant Diagnostic Tests:  8/6 CXR> Diffuse bilateral pulmonary infiltrates   Micro Data:  7/25 SARS CoV2> positive  8/6 CTA on hold due to tenuous pulmonary status 06/07/2019 sputum>> 06/05/2019 urine culture negative 06/05/2019 blood cultures x2>>  Antimicrobials:  8/6 Azithromycin>>>8/10 8/6 Rocephin>>>8/12  Interim history/subjective:  No acute events.  Weaning PEEP/FiO2.  Objective   Blood pressure (!) 83/50, pulse 77, temperature 100.3 F (37.9 C), temperature source Oral, resp. rate (!) 28, height 5\' 2"  (1.575 m), weight 69.9 kg, SpO2 91 %. CVP:  [6 mmHg-10 mmHg] 10 mmHg  Vent Mode: PRVC FiO2 (%):  [50 %-60 %] 60 % Set Rate:  [28 bmp] 28 bmp Vt Set:  [440 mL] 440 mL PEEP:  [8 cmH20] 8 cmH20 Plateau Pressure:  [16 cmH20-22 cmH20] 22 cmH20   Intake/Output Summary (Last  24 hours) at 06/12/2019 0849 Last data filed at 06/12/2019 0800 Gross per 24 hour  Intake 1693.39 ml  Output 3680 ml  Net -1986.61 ml   Filed Weights   06/08/19 0500 06/10/19 0500 06/12/19 0500  Weight: 69.7 kg 76.5 kg 69.9 kg   Physical Exam: Gen:      No acute distress HEENT:  EOMI, sclera anicteric Neck:     No masses; no thyromegaly, ETT Lungs:    Clear to auscultation bilaterally; normal respiratory effort CV:         Regular rate and rhythm; no murmurs Abd:      + bowel sounds; soft, non-tender; no palpable masses, no distension Ext:    No edema; adequate peripheral perfusion Skin:      Warm and dry; no rash Neuro: Sedated, intubated.   Resolved Hospital Problem list     Assessment & Plan:  ARDS due to COVID-19 pneumonia: C/b right PTX S/p remdesivir. Did not receive Actemra [facility] however will defer at this time due to improving oxygenation and CRP <10 at time of transfer. Plan Continue ARDS protocol Wean PEEP/FiO2 Start pressure support trials once he is on minimal vent settings No need for proning given improvement in vent status.  Right pneumothorax s/p chest tube Continue chest tube to suction  Agitation requiring sedation: Reported to be heavy drinker Continue thiamine and folic acid Wean down sedation.  Best practice:  Diet: npo Pain/Anxiety/Delirium protocol (if indicated): not indicated VAP protocol (if indicated): not  indicated DVT prophylaxis: Reduce to prophylactic lovenox due to bleeding GI prophylaxis: not indicated Glucose control: ssi Mobility: Bedrest with prone protocol Code Status: FULL Family Communication: Per TRH Disposition: Strong ICU   Labs   CBC: Recent Labs  Lab 06/05/19 1306 06/05/19 1316  WBC 12.4*  --   NEUTROABS 9.9*  --   HGB 15.0 15.3  HCT 44.9 45.0  MCV 98.9  --   PLT 248  --     Recent Labs  Lab 06/10/19 1215  06/11/19 0647 06/11/19 0925 06/12/19 0323 06/12/19 0519  NA 145   < > 146* 146*  150* 144  K 4.6   < > 4.0 4.1 3.0* 4.2  CL 108  --  103  --   --  102  CO2 29  --  36*  --   --  34*  BUN 30*  --  33*  --   --  32*  CREATININE 0.55*  --  0.62  --   --  0.71  GLUCOSE 128*  --  89  --   --  107*   < > = values in this interval not displayed.   Recent Labs  Lab 06/10/19 0350  06/11/19 0647 06/11/19 0925 06/12/19 0323 06/12/19 0519  HGB 13.1   < > 12.3* 12.9* 10.5* 11.9*  HCT 43.5   < > 41.7 38.0* 31.0* 40.2  WBC 10.5  --  10.2  --   --  8.4  PLT 222  --  212  --   --  215   < > = values in this interval not displayed.     No results for input(s): LIPASE, AMYLASE in the last 168 hours. No results for input(s): AMMONIA in the last 168 hours.  ABG    Component Value Date/Time   PHART 7.439 06/12/2019 0323   PCO2ART 41.4 06/12/2019 0323   PO2ART 51.0 (L) 06/12/2019 0323   HCO3 28.0 06/12/2019 0323   TCO2 29 06/12/2019 0323   ACIDBASEDEF 2.0 06/08/2019 0213   O2SAT 87.0 06/12/2019 0323   Coagulation Profile: Lab Results  Component Value Date   INR 1.3 (H) 06/05/2019    Cardiac Enzymes: No results for input(s): CKTOTAL, CKMB, CKMBINDEX, TROPONINI in the last 168 hours.  HbA1C: No results found for: HGBA1C  CBG: CBG (last 3)  Recent Labs    06/11/19 2341 06/12/19 0324 06/12/19 0826  GLUCAP 107* 100* 111*   COVID-19 Labs  Recent Labs    06/10/19 0350 06/10/19 1215 06/11/19 0647 06/12/19 0519  DDIMER  --  2.78* 2.13* 2.00*  FERRITIN 532* 465* 370*  --   CRP  --  7.5* 13.3*  --     Lab Results  Component Value Date   SARSCOV2NAA DETECTED (A) 05/24/2019   The patient is critically ill with multiple organ system failure and requires high complexity decision making for assessment and support, frequent evaluation and titration of therapies, advanced monitoring, review of radiographic studies and interpretation of complex data.   Critical Care Time devoted to patient care services, exclusive of separately billable procedures, described  in this note is 35 minutes.   Marshell Garfinkel MD [facility] Pager [phone] If no answer call 336 [phone] 06/12/2019, 12:54 PM

## 2019-06-13 DIAGNOSIS — L8990 Pressure ulcer of unspecified site, unspecified stage: Secondary | ICD-10-CM | POA: Insufficient documentation

## 2019-06-13 DIAGNOSIS — U071 COVID-19: Secondary | ICD-10-CM | POA: Diagnosis not present

## 2019-06-13 DIAGNOSIS — J988 Other specified respiratory disorders: Secondary | ICD-10-CM | POA: Diagnosis not present

## 2019-06-13 LAB — COMPREHENSIVE METABOLIC PANEL
ALT: 49 U/L — ABNORMAL HIGH (ref 0–44)
AST: 42 U/L — ABNORMAL HIGH (ref 15–41)
Albumin: 1.9 g/dL — ABNORMAL LOW (ref 3.5–5.0)
Alkaline Phosphatase: 71 U/L (ref 38–126)
Anion gap: 7 (ref 5–15)
BUN: 27 mg/dL — ABNORMAL HIGH (ref 6–20)
CO2: 30 mmol/L (ref 22–32)
Calcium: 7.9 mg/dL — ABNORMAL LOW (ref 8.9–10.3)
Chloride: 104 mmol/L (ref 98–111)
Creatinine, Ser: 0.52 mg/dL — ABNORMAL LOW (ref 0.61–1.24)
GFR calc Af Amer: >60 mL/min (ref >60)
GFR calc non Af Amer: >60 mL/min (ref >60)
Glucose, Bld: 115 mg/dL — ABNORMAL HIGH (ref 70–99)
Potassium: 3.8 mmol/L (ref 3.5–5.1)
Sodium: 141 mmol/L (ref 135–145)
Total Bilirubin: 0.4 mg/dL (ref 0.3–1.2)
Total Protein: 5.4 g/dL — ABNORMAL LOW (ref 6.5–8.1)

## 2019-06-13 LAB — GLUCOSE, CAPILLARY
Glucose-Capillary: 114 mg/dL — ABNORMAL HIGH (ref 70–99)
Glucose-Capillary: 110 mg/dL — ABNORMAL HIGH (ref 70–99)
Glucose-Capillary: 107 mg/dL — ABNORMAL HIGH (ref 70–99)
Glucose-Capillary: 134 mg/dL — ABNORMAL HIGH (ref 70–99)
Glucose-Capillary: 122 mg/dL — ABNORMAL HIGH (ref 70–99)
Glucose-Capillary: 92 mg/dL (ref 70–99)

## 2019-06-13 LAB — CBC WITH DIFFERENTIAL/PLATELET
Abs Immature Granulocytes: 0.05 10*3/uL (ref 0.00–0.07)
Basophils Absolute: 0 10*3/uL (ref 0.0–0.1)
Basophils Relative: 0 %
Eosinophils Absolute: 0.2 10*3/uL (ref 0.0–0.5)
Eosinophils Relative: 3 %
HCT: 38.2 % — ABNORMAL LOW (ref 39.0–52.0)
Hemoglobin: 11.4 g/dL — ABNORMAL LOW (ref 13.0–17.0)
Immature Granulocytes: 1 %
Lymphocytes Relative: 18 %
Lymphs Abs: 1.1 10*3/uL (ref 0.7–4.0)
MCH: 31.8 pg (ref 26.0–34.0)
MCHC: 29.8 g/dL — ABNORMAL LOW (ref 30.0–36.0)
MCV: 106.7 fL — ABNORMAL HIGH (ref 80.0–100.0)
Monocytes Absolute: 0.9 10*3/uL (ref 0.1–1.0)
Monocytes Relative: 14 %
Neutro Abs: 4.1 10*3/uL (ref 1.7–7.7)
Neutrophils Relative %: 64 %
Platelets: 245 10*3/uL (ref 150–400)
RBC: 3.58 MIL/uL — ABNORMAL LOW (ref 4.22–5.81)
RDW: 13.4 % (ref 11.5–15.5)
WBC: 6.4 10*3/uL (ref 4.0–10.5)
nRBC: 0 % (ref 0.0–0.2)

## 2019-06-13 LAB — PHOSPHORUS: Phosphorus: 3.4 mg/dL (ref 2.5–4.6)

## 2019-06-13 LAB — MAGNESIUM: Magnesium: 1.9 mg/dL (ref 1.7–2.4)

## 2019-06-13 LAB — FERRITIN: Ferritin: 325 ng/mL (ref 24–336)

## 2019-06-13 LAB — CK: Total CK: 22 U/L — ABNORMAL LOW (ref 49–397)

## 2019-06-13 LAB — TRIGLYCERIDES: Triglycerides: 144 mg/dL (ref <150)

## 2019-06-13 LAB — C-REACTIVE PROTEIN: CRP: 6.8 mg/dL — ABNORMAL HIGH (ref <1.0)

## 2019-06-13 LAB — D-DIMER, QUANTITATIVE: D-Dimer, Quant: 1.73 ug/mL-FEU — ABNORMAL HIGH (ref 0.00–0.50)

## 2019-06-13 MED ORDER — FENTANYL CITRATE (PF) 100 MCG/2ML IJ SOLN: 25.0000 ug | INTRAMUSCULAR | Status: DC | PRN

## 2019-06-13 MED ORDER — POLYETHYLENE GLYCOL 3350 17 G PO PACK
17.0000 g | PACK | Freq: Two times a day (BID) | ORAL | Status: DC
  Administered 2019-06-14: 17 g via ORAL
  Filled 2019-06-13: qty 1

## 2019-06-13 MED ORDER — VITAMIN B-1 100 MG PO TABS
100.0000 mg | ORAL_TABLET | Freq: Every day | ORAL | Status: DC
  Administered 2019-06-14 – 2019-06-17 (×4): 100 mg via ORAL
  Filled 2019-06-13 (×4): qty 1

## 2019-06-13 MED ORDER — FUROSEMIDE 10 MG/ML IJ SOLN
20.0000 mg | Freq: Every day | INTRAMUSCULAR | Status: DC
  Administered 2019-06-13 – 2019-06-14 (×2): 20 mg via INTRAVENOUS
  Filled 2019-06-13 (×2): qty 2

## 2019-06-13 MED ORDER — LORAZEPAM 0.5 MG PO TABS: 0.5000 mg | ORAL_TABLET | Freq: Four times a day (QID) | ORAL | Status: DC | PRN

## 2019-06-13 MED ORDER — LORAZEPAM 2 MG/ML IJ SOLN: 0.5000 mg | Freq: Four times a day (QID) | INTRAMUSCULAR | Status: DC | PRN

## 2019-06-13 MED ORDER — DOCUSATE SODIUM 100 MG PO CAPS
100.0000 mg | ORAL_CAPSULE | Freq: Two times a day (BID) | ORAL | Status: DC
  Administered 2019-06-14 – 2019-06-17 (×6): 100 mg via ORAL
  Filled 2019-06-13 (×8): qty 1

## 2019-06-13 MED ORDER — CLONIDINE HCL 0.1 MG PO TABS
0.1000 mg | ORAL_TABLET | Freq: Three times a day (TID) | ORAL | Status: DC
  Administered 2019-06-13 – 2019-06-14 (×3): 0.1 mg via ORAL
  Filled 2019-06-13 (×3): qty 1

## 2019-06-13 MED ORDER — ADULT MULTIVITAMIN LIQUID CH
15.0000 mL | Freq: Every day | ORAL | Status: DC
  Administered 2019-06-14 – 2019-06-17 (×4): 15 mL via ORAL
  Filled 2019-06-13 (×4): qty 15

## 2019-06-13 MED ORDER — FOLIC ACID 1 MG PO TABS
1.0000 mg | ORAL_TABLET | Freq: Every day | ORAL | Status: DC
  Administered 2019-06-14 – 2019-06-17 (×4): 1 mg via ORAL
  Filled 2019-06-13 (×4): qty 1

## 2019-06-13 MED ORDER — PANTOPRAZOLE SODIUM 40 MG PO TBEC
40.0000 mg | DELAYED_RELEASE_TABLET | Freq: Every day | ORAL | Status: DC
  Administered 2019-06-14: 40 mg via ORAL
  Filled 2019-06-13: qty 1

## 2019-06-13 MED ORDER — OXYCODONE HCL 5 MG PO TABS: 5.0000 mg | ORAL_TABLET | Freq: Four times a day (QID) | ORAL | Status: DC | PRN

## 2019-06-13 NOTE — Evaluation (Signed)
Clinical/Bedside Swallow Evaluation Patient Details  Name: Ronith Berti MRN: [id number] Date of Birth: 1971/06/11  Today's Date: 06/13/2019 Time: SLP Start Time (ACUTE ONLY): 0174 SLP Stop Time (ACUTE ONLY): 1608 SLP Time Calculation (min) (ACUTE ONLY): 17 min  Past Medical History: History reviewed. No pertinent past medical history. Past Surgical History: History reviewed. No pertinent surgical history. HPI:  [name] COVID-19 +, exhibiting increasing oxygen requirements in ED. Intubated 8/8-14.  Developed right pneumothorax requiring chest tube placement.    Assessment / Plan / Recommendation Clinical Impression  Pt participated in clinical swallow assessment with stratus video interpreter utilized.  He presented with strong voice and cough s/p extubation, oxygenating high 90s on 2 liters [place].  He passed three oz water test without difficulty, consumed alternating solids and liquids with no s/s of dysphagia, no concerns for aspiration after six day intubation. Pt currently on clear liquids; recommend advancing diet per RN's discretion.  No SLP f/u is needed - our service will sign off.   SLP Visit Diagnosis: Dysphagia, unspecified (R13.10)    Aspiration Risk  No limitations    Diet Recommendation   Advance per protocol - no dysphagia  Medication Administration: Whole meds with liquid(give whole in puree if coughing)    Other  Recommendations Oral Care Recommendations: Oral care BID   Follow up Recommendations None      Frequency and Duration            Prognosis        Swallow Study   General Date of Onset: 06/05/19 HPI: [name] COVID-19 +, exhibiting increasing oxygen requirements in ED. Intubated 8/8-14.  Developed right pneumothorax requiring chest tube placement.  Type of Study: Bedside Swallow Evaluation Previous Swallow Assessment: no Diet Prior to this Study: Thin liquids Temperature Spikes Noted: No Respiratory Status: Nasal cannula History of Recent Intubation:  Yes Length of Intubations (days): 6 days Date extubated: 06/13/19 Behavior/Cognition: Alert;Cooperative Oral Cavity Assessment: Within Functional Limits Oral Care Completed by SLP: Recent completion by staff Oral Cavity - Dentition: Adequate natural dentition Vision: Functional for self-feeding Self-Feeding Abilities: Able to feed self Patient Positioning: Upright in bed Baseline Vocal Quality: Normal Volitional Cough: Strong Volitional Swallow: Able to elicit    Oral/Motor/Sensory Function Overall Oral Motor/Sensory Function: Within functional limits   Ice Chips Ice chips: Within functional limits   Thin Liquid Thin Liquid: Within functional limits    Nectar Thick Nectar Thick Liquid: Not tested   Honey Thick Honey Thick Liquid: Not tested   Puree Puree: Within functional limits   Solid     Solid: Not tested      Juan Quam Laurice 06/13/2019,4:13 PM   Estill Bamberg L. Tivis Ringer, [place] Office number [phone] Pager [phone]

## 2019-06-13 NOTE — Progress Notes (Signed)
PROGRESS NOTE    Michael Clay  YBO:[id number] DOB: 1971-01-14 DOA: 06/05/2019 PCP: Patient, No Pcp Per   Brief Narrative:  Michael Clay is a 48 y.o.  Hispanic male PMHx ETOH abuse    Presenting with worsening SOB in the setting of COVID.  He has not received care through Epic prior to an initial Urgent Care visit on 7/25.  At that time, he reported subjective fever starting 5 days prior (about 7/20).  He was afebrile at that visit and was tested for COVID.   He was notified on 7/30 that his COVID test was positive.  He called back yesterday with c/o persistent cough and was called in [place].  He called back today and reported SOB with O2 sats 69% at home.  EMS was called and sats were 66% with patient walking around his house, improved to 91% on 15L NRB. Denies significant cough.   ED Course: Diagnosed with COVID 3 days ago.  Worsening SOB x 2 days.  62% with EMS while ambulating. Difficulty getting sats up.  Placed on 15L NRB to low 90s, dropped to 80s.  Plan for high flow [place] but can't do that at [facility].  Placed on [place] O2 and also NRB and now finally with enough O2, improved WOB.  Troponin 100, CTA ordered to determine heparin or not.  May need PCCM consult.  Subjective: Tolerating diet and extubation.   Assessment & Plan:   Principal Problem:   Respiratory tract infection due to COVID-19 virus Active Problems:   COVID-19   Tension pneumothorax, spontaneous   Acute respiratory failure with hypoxia (HCC)   Pneumonia due to COVID-19 virus   Pneumothorax on right   CAP (community acquired pneumonia)   ETOH abuse   Pressure injury of skin  Acute respiratory failure with hypoxia/COVID pneumonia Recent Labs  Lab 06/08/19 0213 06/10/19 1215 06/11/19 0647 06/12/19 0519 06/13/19 0445  CRP 12.1* 7.5* 13.3* 13.9* 6.8*   Recent Labs  Lab 06/13/19 0445  DDIMER 1.73*  -Decadron 6 mg daily -Remdesivir per pharmacy protocol completed -Low dose Lovenox based on d-dimer -After  discussing patient's case decided not to proceed with Actemra although patient meets guidelines secondary to his continued improvement. -Patient had received antibiotics prior to admission to [facility] see below  Acute RIGHT pneumothorax -Intubated 8/8.  Reposition on 8/9 for recurrent pneumothorax -Negative water leak - Chest tube suction; once pneumothorax resolved and patient titrated to waterseal will remove chest tube -Daily ABG  CAP -Patient started on antibiotics for CAP prior to admission, complete course  EtOH abuse  - 8/7 urine tox screen negative - EtOH screen not obtained - Folic acid 1 mg daily - In 100 mg daily -- May need precedex  DVT prophylaxis: Lovenox BID Code Status: Full Family Communication: 8/14 contacted wife Ms. [name] who only speaks a little English requested that I contact her in the morning with a translator in order to update her on the plan of care for her husband. Disposition Plan: TBD  Consultants:  PCCM  Procedures/Significant Events:  06/07/2019 intubation #7.5 cuffed 06/07/2019 right chest tube insertion 06/08/2019 chest tube repositioned 06/13/2019 extubation  I have personally reviewed and interpreted all radiology studies and my findings are as above.  VENTILATOR SETTINGS: Vent Mode: PRVC FiO2 (%):  [40 %] 40 % Set Rate:  [28 bmp] 28 bmp Vt Set:  [440 mL] 440 mL PEEP:  [5 cmH20-8 cmH20] 5 cmH20 Pressure Support:  [8 cmH20] 8 cmH20 Plateau Pressure:  [  21 cmH20-23 cmH20] 23 cmH20   Cultures 7/25 SARS CoV2> positive  8/6 CTA on hold due to tenuous pulmonary status 06/07/2019 sputum>> 06/05/2019 urine culture negative 06/05/2019 blood cultures x2>>  Antimicrobials: Anti-infectives (From admission, onward)   Start     Stop   06/06/19 2000  remdesivir 100 mg in sodium chloride 0.9 % 250 mL IVPB     06/09/19 2020   06/06/19 1400  azithromycin (ZITHROMAX) 500 mg in sodium chloride 0.9 % 250 mL IVPB     06/09/19 1625    06/06/19 1200  cefTRIAXone (ROCEPHIN) 1 g in sodium chloride 0.9 % 100 mL IVPB     06/11/19 2359   06/05/19 2000  remdesivir 200 mg in sodium chloride 0.9 % 250 mL IVPB     06/05/19 2138   06/05/19 1400  cefTRIAXone (ROCEPHIN) 1 g in sodium chloride 0.9 % 100 mL IVPB     06/05/19 1503   06/05/19 1400  azithromycin (ZITHROMAX) 500 mg in sodium chloride 0.9 % 250 mL IVPB     06/05/19 1503       Devices   LINES / TUBES:  #7.5 cuffed ETT 8/8>> 06/13/2019 RIGHT chest tube insertion 8/8>>   Continuous Infusions: . sodium chloride Stopped (06/07/19 1658)  . sodium chloride 10 mL/hr at 06/13/19 1600     Objective: Vitals:   06/13/19 1400 06/13/19 1500 06/13/19 1600 06/13/19 1630  BP: 125/81 121/74 125/84 113/66  Pulse: 70 65 70 69  Resp: (!) 26 (!) 24 (!) 26 (!) 28  Temp:   98 F (36.7 C)   TempSrc:   Oral   SpO2: 100% 100% 100% 98%  Weight:      Height:        Intake/Output Summary (Last 24 hours) at 06/13/2019 1814 Last data filed at 06/13/2019 1600 Gross per 24 hour  Intake 1355.41 ml  Output 2805 ml  Net -1449.59 ml   Filed Weights   06/10/19 0500 06/12/19 0500 06/13/19 0500  Weight: 76.5 kg 69.9 kg 77 kg    Examination:  General: Alert, follows commands, positive acute respiratory distress Eyes: negative scleral hemorrhage, negative anisocoria, negative icterus ENT: Negative Runny nose, negative gingival bleeding, # 7.5 ETT in place negative signs of bleeding or infection Neck:  Negative scars, masses, torticollis, lymphadenopathy, JVD Lungs: Clear to auscultation bilaterally without wheezes or crackles Cardiovascular: Regular rate and rhythm without murmur gallop or rub normal S1 and S2 Abdomen: negative abdominal pain, nondistended, positive soft, bowel sounds, no rebound, no ascites, no appreciable mass Extremities: No significant cyanosis, clubbing, or edema bilateral lower extremities Skin: Negative rashes, lesions, ulcers Psychiatric:  Negative  depression, negative anxiety, negative fatigue, negative mania  Central nervous system:  Cranial nerves II through XII intact, tongue/uvula midline, all extremities muscle strength 5/5, sensation intact throughout, negative dysarthria, negative expressive aphasia, negative receptive aphasia.  Data Reviewed: Care during the described time interval was provided by me .  I have reviewed this patient's available data, including medical history, events of note, physical examination, and all test results as part of my evaluation.  CBC: Recent Labs  Lab 06/08/19 0213  06/09/19 0337  06/10/19 0350  06/11/19 0647 06/11/19 0925 06/12/19 0323 06/12/19 0519 06/13/19 0445  WBC 19.0*  --  7.0  --  10.5  --  10.2  --   --  8.4 6.4  NEUTROABS 16.3*  --  5.7  --   --   --  8.0*  --   --  5.8 4.1  HGB 15.1  16.0   < > 12.0*   < > 13.1   < > 12.3* 12.9* 10.5* 11.9* 11.4*  HCT 48.6  47.0   < > 38.1*   < > 43.5   < > 41.7 38.0* 31.0* 40.2 38.2*  MCV 105.0*  --  104.4*  --  106.6*  --  108.0*  --   --  108.4* 106.7*  PLT 371  --  191  --  222  --  212  --   --  215 245   < > = values in this interval not displayed.   Basic Metabolic Panel: Recent Labs  Lab 06/10/19 0350 06/10/19 1215  06/11/19 0647 06/11/19 0925 06/12/19 0323 06/12/19 0519 06/13/19 0445  NA 144 145   < > 146* 146* 150* 144 141  K 4.5 4.6   < > 4.0 4.1 3.0* 4.2 3.8  CL 105 108  --  103  --   --  102 104  CO2 29 29  --  36*  --   --  34* 30  GLUCOSE 104* 128*  --  89  --   --  107* 115*  BUN 28* 30*  --  33*  --   --  32* 27*  CREATININE 0.69 0.55*  --  0.62  --   --  0.71 0.52*  CALCIUM 7.8* 7.7*  --  7.9*  --   --  7.9* 7.9*  MG 2.2 2.0  --  1.9  --   --  2.2 1.9  PHOS 4.3 4.1  --  3.6  --   --  3.8 3.4   < > = values in this interval not displayed.   GFR: Estimated Creatinine Clearance: 100.5 mL/min (A) (by C-G formula based on SCr of 0.52 mg/dL (L)). Liver Function Tests: Recent Labs  Lab 06/10/19 0350 06/10/19 1215  06/11/19 0647 06/12/19 0519 06/13/19 0445  AST 161* 174* 78* 48* 42*  ALT 70* 72* 62* 49* 49*  ALKPHOS 79 78 76 74 71  BILITOT 0.5 0.8 0.6 0.9 0.4  PROT 5.8* 5.4* 5.6* 5.8* 5.4*  ALBUMIN 2.2* 2.0* 2.0* 2.0* 1.9*   No results for input(s): LIPASE, AMYLASE in the last 168 hours. No results for input(s): AMMONIA in the last 168 hours. Coagulation Profile: No results for input(s): INR, PROTIME in the last 168 hours. Cardiac Enzymes: Recent Labs  Lab 06/10/19 1215 06/11/19 0647 06/12/19 0519 06/13/19 0445  CKTOTAL 92 45* 27* 22*   BNP (last 3 results) No results for input(s): PROBNP in the last 8760 hours. HbA1C: No results for input(s): HGBA1C in the last 72 hours. CBG: Recent Labs  Lab 06/12/19 2344 06/13/19 0309 06/13/19 0800 06/13/19 1138 06/13/19 1611  GLUCAP 121* 110* 107* 134* 122*   Lipid Profile: Recent Labs    06/12/19 0546 06/13/19 0445  TRIG 124 144   Thyroid Function Tests: No results for input(s): TSH, T4TOTAL, FREET4, T3FREE, THYROIDAB in the last 72 hours. Anemia Panel: Recent Labs    06/12/19 0519 06/13/19 0445  FERRITIN 382* 325   Urine analysis:    Component Value Date/Time   COLORURINE YELLOW 06/05/2019 1459   APPEARANCEUR CLEAR 06/05/2019 1459   LABSPEC 1.011 06/05/2019 1459   PHURINE 6.0 06/05/2019 1459   GLUCOSEU NEGATIVE 06/05/2019 1459   HGBUR NEGATIVE 06/05/2019 1459   BILIRUBINUR NEGATIVE 06/05/2019 1459   KETONESUR 5 (A) 06/05/2019 1459   PROTEINUR NEGATIVE 06/05/2019 1459   NITRITE NEGATIVE  06/05/2019 [place] 06/05/2019 1459   Sepsis Labs:  Recent Results (from the past 240 hour(s))  Blood Culture (routine x 2)     Status: None   Collection Time: 06/05/19  1:30 PM   Specimen: BLOOD  Result Value Ref Range Status   Specimen Description BLOOD RIGHT ANTECUBITAL  Final   Special Requests   Final    BOTTLES DRAWN AEROBIC AND ANAEROBIC Blood Culture results may not be optimal due to an inadequate  volume of blood received in culture bottles   Culture   Final    NO GROWTH 5 DAYS Performed at [facility], [place] [address], [place], [place] [address]    Report Status 06/10/2019 FINAL  Final  Blood Culture (routine x 2)     Status: None   Collection Time: 06/05/19  1:54 PM   Specimen: BLOOD LEFT HAND  Result Value Ref Range Status   Specimen Description BLOOD LEFT HAND  Final   Special Requests   Final    BOTTLES DRAWN AEROBIC AND ANAEROBIC Blood Culture results may not be optimal due to an inadequate volume of blood received in culture bottles   Culture   Final    NO GROWTH 5 DAYS Performed at [facility], [place] [address] [place], [place] [address]    Report Status 06/10/2019 FINAL  Final  Urine culture     Status: None   Collection Time: 06/05/19  2:59 PM   Specimen: In/Out Cath Urine  Result Value Ref Range Status   Specimen Description IN/OUT CATH URINE  Final   Special Requests NONE  Final   Culture   Final    NO GROWTH Performed at [facility], [place] [address], [place], [place] [address]    Report Status 06/06/2019 FINAL  Final  MRSA PCR Screening     Status: None   Collection Time: 06/09/19  3:50 PM   Specimen: Nasopharyngeal  Result Value Ref Range Status   MRSA by PCR NEGATIVE NEGATIVE Final    Comment:        The GeneXpert MRSA Assay (FDA approved for NASAL specimens only), is one component of a comprehensive MRSA colonization surveillance program. It is not intended to diagnose MRSA infection nor to guide or monitor treatment for MRSA infections. Performed at [facility], [place] [address], [place], [place] [address]   Culture, respiratory (non-expectorated)     Status: None   Collection Time: 06/10/19  5:09 AM   Specimen: Tracheal Aspirate; Respiratory  Result Value Ref Range Status   Specimen Description   Final    TRACHEAL ASPIRATE Performed at [place] [address], [place], [place] [address]     Special Requests   Final    Normal Performed at [facility], [place] [address] [place], [place] [address]    Gram Stain   Final    FEW WBC PRESENT, PREDOMINANTLY PMN FEW GRAM POSITIVE COCCI IN PAIRS IN CLUSTERS RARE GRAM NEGATIVE RODS    Culture   Final    FEW STAPHYLOCOCCUS SPECIES (COAGULASE NEGATIVE) CALL MICROBIOLOGY LAB IF SENSITIVITIES ARE REQUIRED. Performed at [facility], [place] [address], [place], [place] [address]    Report Status 06/12/2019 FINAL  Final    Radiology Studies: No results found. Scheduled Meds: . chlorhexidine gluconate (MEDLINE KIT)  15 mL Mouth Rinse BID  . Chlorhexidine Gluconate Cloth  6 each Topical Daily  . cloNIDine  0.1 mg Oral TID  . dexamethasone (DECADRON) injection  6 mg Intravenous Q24H  . docusate sodium  100 mg Oral BID  . enoxaparin (LOVENOX) injection  40 mg Subcutaneous Q12H  . feeding supplement (PRO-STAT SUGAR FREE 64)  30 mL Per Tube TID  . [START ON 4/46/2863] folic acid  1 mg Oral Daily  . furosemide  20 mg Intravenous Daily  . insulin aspart  0-9 Units Subcutaneous Q4H  . mouth rinse  15 mL Mouth Rinse 10 times per day  . [START ON 06/14/2019] multivitamin  15 mL Oral Daily  . [START ON 06/14/2019] pantoprazole  40 mg Oral Daily  . polyethylene glycol  17 g Oral BID  . sodium chloride flush  3 mL Intravenous Q12H  . sodium chloride flush  3 mL Intravenous Q12H  . [START ON 06/14/2019] thiamine  100 mg Oral Daily   Continuous Infusions: . sodium chloride Stopped (06/07/19 1658)  . sodium chloride 10 mL/hr at 06/13/19 1600     LOS: 8 days   Time spent: 40 minutes  Berle Mull, MD [facility]  If 7PM-7AM, please contact night-coverage [email or web address] Password [facility] 06/13/2019, 6:14 PM

## 2019-06-13 NOTE — Progress Notes (Signed)
Extubated Pt to 4 lpm [place] tolerated well.  Able to speak, good productive cough. Sat 96%, RR 18, HR 77.  Will continue to monitoe.

## 2019-06-13 NOTE — Progress Notes (Signed)
Assisted patient and his wife, Verdis Frederickson, with video call. Provided an update and answered all questions via interpreter services.

## 2019-06-13 NOTE — Progress Notes (Signed)
NAME:  Michael Clay, MRN:  [id number], DOB:  1971-07-13, LOS: 8 ADMISSION DATE:  06/05/2019, CONSULTATION DATE:  06/05/2019 REFERRING MD:  Lorin [facility] , CHIEF COMPLAINT:  SOB  Brief History   [name] COVID-19 +, exhibiting increasing oxygen requirements in ED. intubated.  Developed right pneumothorax requiring chest tube placement.  Past Medical History  No known [facility] Events   8/6> admit to [facility] under hospitalist service   Consults:  PCCM   Procedures:  06/07/2019 intubation 06/07/2019 right chest tube insertion 06/08/2019 chest tube repositioned  Significant Diagnostic Tests:  8/6 CXR> Diffuse bilateral pulmonary infiltrates   Micro Data:  7/25 SARS CoV2> positive  8/6 CTA on hold due to tenuous pulmonary status 06/07/2019 sputum>> 06/05/2019 urine culture negative 06/05/2019 blood cultures x2>>  Antimicrobials/COVID Rx  Azithromycin 8/6 >8/10 8/6 Rocephin8/6 >8/12  Decadron 8/7 >> Remdesivir 8/6 > 8/10  Interim history/subjective:  Doing well on pressure support weans.  Hemodynamically stable No acute events.  Objective   Blood pressure 126/76, pulse 83, temperature 98.1 F (36.7 C), temperature source Oral, resp. rate 17, height 5\' 2"  (1.575 m), weight 77 kg, SpO2 94 %. CVP:  [7 mmHg-12 mmHg] 7 mmHg  Vent Mode: PRVC FiO2 (%):  [40 %-50 %] 40 % Set Rate:  [28 bmp] 28 bmp Vt Set:  [440 mL] 440 mL PEEP:  [5 cmH20-8 cmH20] 5 cmH20 Pressure Support:  [8 cmH20] 8 cmH20 Plateau Pressure:  [21 PQZ30-07 cmH20] 23 cmH20   Intake/Output Summary (Last 24 hours) at 06/13/2019 0837 Last data filed at 06/13/2019 0800 Gross per 24 hour  Intake 1866.8 ml  Output 2485 ml  Net -618.2 ml   Filed Weights   06/10/19 0500 06/12/19 0500 06/13/19 0500  Weight: 76.5 kg 69.9 kg 77 kg   Physical Exam: Gen:      No acute distress HEENT:  EOMI, sclera anicteric Neck:     No masses; no thyromegaly, ET tube Lungs:    Clear to auscultation bilaterally; normal respiratory  effort CV:         Regular rate and rhythm; no murmurs Abd:      + bowel sounds; soft, non-tender; no palpable masses, no distension Ext:    No edema; adequate peripheral perfusion Skin:      Warm and dry; no rash Neuro: Awake, responsive.  Resolved Hospital Problem list     Assessment & Plan:  ARDS due to COVID-19 pneumonia: C/b right PTX S/p remdesivir. Did not receive Actemra prior to transfer to [facility] however will defer at this time due to improving oxygenation and CRP <10 at time of transfer. Plan Plan for extubation today Wean down oxygen  Right pneumothorax s/p chest tube Continue chest tube to suction  Agitation requiring sedation: Reported to be heavy drinker Continue thiamine and folic acid Wean down sedation.  Best practice:  Diet: npo Pain/Anxiety/Delirium protocol (if indicated): Propofol, fentanyl.  VAP protocol (if indicated): Yes DVT prophylaxis: Lovenox GI prophylaxis: Protonix Glucose control: ssi Mobility: Bedrest with prone protocol Code Status: FULL Family Communication: Per TRH Disposition: [place] ICU  Critical care time: 35 mins   The patient is critically ill with multiple organ system failure and requires high complexity decision making for assessment and support, frequent evaluation and titration of therapies, advanced monitoring, review of radiographic studies and interpretation of complex data.   Marshell Garfinkel MD [facility] Pager [phone] If no answer call [phone] 06/13/2019, 8:38  AM

## 2019-06-14 ENCOUNTER — Inpatient Hospital Stay (HOSPITAL_COMMUNITY): Payer: HRSA Program

## 2019-06-14 DIAGNOSIS — U071 COVID-19: Secondary | ICD-10-CM | POA: Diagnosis not present

## 2019-06-14 DIAGNOSIS — J9601 Acute respiratory failure with hypoxia: Secondary | ICD-10-CM

## 2019-06-14 LAB — CBC WITH DIFFERENTIAL/PLATELET
Abs Immature Granulocytes: 0.02 10*3/uL (ref 0.00–0.07)
Basophils Absolute: 0 10*3/uL (ref 0.0–0.1)
Basophils Relative: 0 %
Eosinophils Absolute: 0.2 10*3/uL (ref 0.0–0.5)
Eosinophils Relative: 4 %
HCT: 40.7 % (ref 39.0–52.0)
Hemoglobin: 12.8 g/dL — ABNORMAL LOW (ref 13.0–17.0)
Immature Granulocytes: 0 %
Lymphocytes Relative: 19 %
Lymphs Abs: 1.2 10*3/uL (ref 0.7–4.0)
MCH: 32 pg (ref 26.0–34.0)
MCHC: 31.4 g/dL (ref 30.0–36.0)
MCV: 101.8 fL — ABNORMAL HIGH (ref 80.0–100.0)
Monocytes Absolute: 0.8 10*3/uL (ref 0.1–1.0)
Monocytes Relative: 12 %
Neutro Abs: 4.3 10*3/uL (ref 1.7–7.7)
Neutrophils Relative %: 65 %
Platelets: 248 10*3/uL (ref 150–400)
RBC: 4 MIL/uL — ABNORMAL LOW (ref 4.22–5.81)
RDW: 12.9 % (ref 11.5–15.5)
WBC: 6.5 10*3/uL (ref 4.0–10.5)
nRBC: 0 % (ref 0.0–0.2)

## 2019-06-14 LAB — GLUCOSE, CAPILLARY
Glucose-Capillary: 100 mg/dL — ABNORMAL HIGH (ref 70–99)
Glucose-Capillary: 110 mg/dL — ABNORMAL HIGH (ref 70–99)
Glucose-Capillary: 127 mg/dL — ABNORMAL HIGH (ref 70–99)
Glucose-Capillary: 74 mg/dL (ref 70–99)
Glucose-Capillary: 85 mg/dL (ref 70–99)
Glucose-Capillary: 89 mg/dL (ref 70–99)

## 2019-06-14 LAB — COMPREHENSIVE METABOLIC PANEL
ALT: 51 U/L — ABNORMAL HIGH (ref 0–44)
AST: 60 U/L — ABNORMAL HIGH (ref 15–41)
Albumin: 2.2 g/dL — ABNORMAL LOW (ref 3.5–5.0)
Alkaline Phosphatase: 69 U/L (ref 38–126)
Anion gap: 7 (ref 5–15)
BUN: 31 mg/dL — ABNORMAL HIGH (ref 6–20)
CO2: 29 mmol/L (ref 22–32)
Calcium: 8.2 mg/dL — ABNORMAL LOW (ref 8.9–10.3)
Chloride: 101 mmol/L (ref 98–111)
Creatinine, Ser: 0.53 mg/dL — ABNORMAL LOW (ref 0.61–1.24)
GFR calc Af Amer: >60 mL/min (ref >60)
GFR calc non Af Amer: >60 mL/min (ref >60)
Glucose, Bld: 75 mg/dL (ref 70–99)
Potassium: 3.8 mmol/L (ref 3.5–5.1)
Sodium: 137 mmol/L (ref 135–145)
Total Bilirubin: 1.1 mg/dL (ref 0.3–1.2)
Total Protein: 6.1 g/dL — ABNORMAL LOW (ref 6.5–8.1)

## 2019-06-14 LAB — MAGNESIUM: Magnesium: 1.6 mg/dL — ABNORMAL LOW (ref 1.7–2.4)

## 2019-06-14 LAB — FERRITIN: Ferritin: 389 ng/mL — ABNORMAL HIGH (ref 24–336)

## 2019-06-14 LAB — PHOSPHORUS: Phosphorus: 3.4 mg/dL (ref 2.5–4.6)

## 2019-06-14 LAB — D-DIMER, QUANTITATIVE: D-Dimer, Quant: 3.17 ug/mL-FEU — ABNORMAL HIGH (ref 0.00–0.50)

## 2019-06-14 LAB — CK: Total CK: 35 U/L — ABNORMAL LOW (ref 49–397)

## 2019-06-14 LAB — C-REACTIVE PROTEIN: CRP: 4 mg/dL — ABNORMAL HIGH (ref <1.0)

## 2019-06-14 MED ORDER — INSULIN ASPART 100 UNIT/ML ~~LOC~~ SOLN
0.0000 [IU] | Freq: Three times a day (TID) | SUBCUTANEOUS | Status: DC
  Administered 2019-06-15: 1 [IU] via SUBCUTANEOUS
  Administered 2019-06-15: 13:00:00 2 [IU] via SUBCUTANEOUS
  Administered 2019-06-16: 1 [IU] via SUBCUTANEOUS
  Administered 2019-06-16: 2 [IU] via SUBCUTANEOUS
  Administered 2019-06-17: 18:00:00 1 [IU] via SUBCUTANEOUS

## 2019-06-14 MED ORDER — CLONIDINE HCL 0.1 MG PO TABS: 0.1000 mg | ORAL_TABLET | Freq: Every day | ORAL | Status: DC

## 2019-06-14 MED ORDER — DEXAMETHASONE 6 MG PO TABS
6.0000 mg | ORAL_TABLET | Freq: Every day | ORAL | Status: DC
  Administered 2019-06-15 – 2019-06-16 (×2): 6 mg via ORAL
  Filled 2019-06-14 (×3): qty 1

## 2019-06-14 MED ORDER — DM-GUAIFENESIN ER 30-600 MG PO TB12
1.0000 | ORAL_TABLET | Freq: Two times a day (BID) | ORAL | Status: DC
  Administered 2019-06-14 – 2019-06-17 (×7): 1 via ORAL
  Filled 2019-06-14 (×9): qty 1

## 2019-06-14 MED ORDER — ENOXAPARIN SODIUM 40 MG/0.4ML ~~LOC~~ SOLN: 40.0000 mg | Freq: Every day | SUBCUTANEOUS | Status: DC

## 2019-06-14 MED ORDER — MAGNESIUM SULFATE 2 GM/50ML IV SOLN
2.0000 g | Freq: Once | INTRAVENOUS | Status: AC
  Administered 2019-06-14: 2 g via INTRAVENOUS
  Filled 2019-06-14: qty 50

## 2019-06-14 MED ORDER — CLONIDINE HCL 0.1 MG PO TABS: 0.1000 mg | ORAL_TABLET | Freq: Two times a day (BID) | ORAL | Status: DC

## 2019-06-14 MED ORDER — POTASSIUM CHLORIDE CRYS ER 20 MEQ PO TBCR
40.0000 meq | EXTENDED_RELEASE_TABLET | Freq: Once | ORAL | Status: AC
  Administered 2019-06-14: 09:00:00 40 meq via ORAL
  Filled 2019-06-14: qty 2

## 2019-06-14 MED ORDER — POLYETHYLENE GLYCOL 3350 17 G PO PACK: 17.0000 g | PACK | Freq: Every day | ORAL | Status: DC | PRN

## 2019-06-14 NOTE — Progress Notes (Signed)
Patient has no emergency contact and states he has his phone and they will call and provide password if updates needed.

## 2019-06-14 NOTE — Progress Notes (Addendum)
   NAME:  Michael Clay, MRN:  [id number], DOB:  [birth year], LOS: 9 ADMISSION DATE:  06/05/2019, CONSULTATION DATE:  06/05/2019 REFERRING MD:  Lorin [facility] , CHIEF COMPLAINT:  SOB  Brief History   [name] COVID-19 +, exhibiting increasing oxygen requirements in ED. intubated.  Developed right pneumothorax requiring chest tube placement.  Past Medical History  No known [facility] Events   8/6> admit to [facility] under hospitalist service  8/14 Extubated  Consults:  PCCM   Procedures:  06/07/2019 intubation >> 8/14 06/07/2019 right chest tube insertion 06/08/2019 chest tube repositioned  Significant Diagnostic Tests:  8/6 CXR> Diffuse bilateral pulmonary infiltrates   Micro Data:  7/25 SARS CoV2> positive  8/6 CTA on hold due to tenuous pulmonary status 06/07/2019 sputum>> 06/05/2019 urine culture negative 06/05/2019 blood cultures x2>>  Antimicrobials/COVID Rx  Azithromycin 8/6 >8/10 Rocephin 8/6 >8/12  Decadron 8/7 >> Remdesivir 8/6 > 8/10  Interim history/subjective:  Extubated yesterday.  Stable respiratory status.  Objective   Blood pressure 123/81, pulse 62, temperature 98.1 F (36.7 C), temperature source Oral, resp. rate (!) 30, height 5\' 2"  (1.575 m), weight 77 kg, SpO2 91 %.        Intake/Output Summary (Last 24 hours) at 06/14/2019 0842 Last data filed at 06/14/2019 0400 Gross per 24 hour  Intake 419.49 ml  Output 2790 ml  Net -2370.51 ml   Filed Weights   06/10/19 0500 06/12/19 0500 06/13/19 0500  Weight: 76.5 kg 69.9 kg 77 kg   Physical Exam: Gen:      No acute distress HEENT:  EOMI, sclera anicteric Neck:     No masses; no thyromegaly Lungs:    Clear to auscultation bilaterally; normal respiratory effort CV:         Regular rate and rhythm; no murmurs Abd:      + bowel sounds; soft, non-tender; no palpable masses, no distension Ext:    No edema; adequate peripheral perfusion Skin:      Warm and dry; no rash Neuro: alert and oriented x 3 Psych:  normal mood and affect  Resolved Hospital Problem list     Assessment & Plan:  ARDS due to COVID-19 pneumonia: C/b right PTX S/p remdesivir. Did not receive Actemra prior to transfer to [facility] however will defer at this time due to improving oxygenation and CRP <10 at time of transfer. Plan Stable post extubation.  Wean down oxygen  Right pneumothorax s/p chest tube Chest x-ray today reviewed with no residual pneumothorax. Chest tube to waterseal and follow chest x-ray.  Agitation requiring sedation: Reported to be heavy drinker Continue thiamine and folic acid Wean down sedation.  Best practice:  Diet: Start PO diet Pain/Anxiety/Delirium protocol (if indicated): Off sedation VAP protocol (if indicated): Yes DVT prophylaxis: Lovenox GI prophylaxis: Protonix Glucose control: SSI Code Status: FULL Family Communication: Per TRH Disposition: [place] ICU  Marshell Garfinkel MD [place] [facility] Pager [phone] If no answer call [phone] 06/14/2019, 8:42 AM

## 2019-06-14 NOTE — Progress Notes (Signed)
PROGRESS NOTE    Michael Clay  FYB:[id number] DOB: 03-14-1971 DOA: 06/05/2019 PCP: Patient, No Pcp Per   Brief Narrative:  Michael Clay is a 48 y.o.  Hispanic male PMHx ETOH abuse    Presenting with worsening SOB in the setting of COVID.  He has not received care through Epic prior to an initial Urgent Care visit on 7/25.  At that time, he reported subjective fever starting 5 days prior (about 7/20).  He was afebrile at that visit and was tested for COVID.   He was notified on 7/30 that his COVID test was positive.  He called back yesterday with c/o persistent cough and was called in [place].  He called back today and reported SOB with O2 sats 69% at home.  EMS was called and sats were 66% with patient walking around his house, improved to 91% on 15L NRB. Denies significant cough.   ED Course: Diagnosed with COVID 3 days ago.  Worsening SOB x 2 days.  62% with EMS while ambulating. Difficulty getting sats up.  Placed on 15L NRB to low 90s, dropped to 80s.  Plan for high flow [place] but can't do that at [facility].  Placed on [place] O2 and also NRB and now finally with enough O2, improved WOB.  Troponin 100, CTA ordered to determine heparin or not.  May need PCCM consult.  Subjective: No acute complaint.  No nausea no vomiting.  Tolerating liquids.  Constipation resolved.  Assessment & Plan:   Principal Problem:   Respiratory tract infection due to COVID-19 virus Active Problems:   COVID-19   Tension pneumothorax, spontaneous   Acute respiratory failure with hypoxia (HCC)   Pneumonia due to COVID-19 virus   Pneumothorax on right   CAP (community acquired pneumonia)   ETOH abuse   Pressure injury of skin  Acute respiratory failure with hypoxia/COVID pneumonia Recent Labs  Lab 06/10/19 1215 06/11/19 0647 06/12/19 0519 06/13/19 0445 06/14/19 0415  CRP 7.5* 13.3* 13.9* 6.8* 4.0*   Recent Labs  Lab 06/14/19 0415  DDIMER 3.17*  -Decadron 6 mg daily -Remdesivir per pharmacy protocol  completed -Low dose Lovenox based on d-dimer -After discussing patient's case decided not to proceed with Actemra although patient meets guidelines secondary to his continued improvement. -Patient had received antibiotics prior to admission to [facility] see below  Acute RIGHT pneumothorax -Intubated 8/8.  Reposition on 8/9 for recurrent pneumothorax -Negative water leak - Chest tube suction; once pneumothorax resolved and patient titrated to waterseal will remove chest tube.  Repeat chest x-ray on 06/15/2019.  May be able to remove it after the results.  PCCM following.  CAP -Patient started on antibiotics for CAP prior to admission, completed course  EtOH abuse  - 8/7 urine tox screen negative - EtOH screen not obtained - Folic acid 1 mg daily - In 100 mg daily Post extubation patient was started on clonidine for behavior control.  Now will gradually taper. Continue PRN oxycodone and Ativan.  FEN.  Was on full liquid.  Advance to regular diet.  Currently on sliding scale insulin.  Likely will not require it tomorrow.   DVT prophylaxis: Lovenox BID Code Status: Full Family Communication:  06/14/2019 discussed with wife. Disposition Plan: Home once hypoxia resolves.  Consultants:  PCCM  Procedures/Significant Events:  06/07/2019 intubation #7.5 cuffed 06/07/2019 right chest tube insertion 06/08/2019 chest tube repositioned 06/13/2019 extubation  I have personally reviewed and interpreted all radiology studies and my findings are as above.  VENTILATOR SETTINGS:  Cultures 7/25 SARS CoV2> positive  8/6 CTA on hold due to tenuous pulmonary status 06/07/2019 sputum>> 06/05/2019 urine culture negative 06/05/2019 blood cultures x2>>  Antimicrobials: Anti-infectives (From admission, onward)   Start     Stop   06/06/19 2000  remdesivir 100 mg in sodium chloride 0.9 % 250 mL IVPB     06/09/19 2020   06/06/19 1400  azithromycin (ZITHROMAX) 500 mg in sodium chloride 0.9 %  250 mL IVPB     06/09/19 1625   06/06/19 1200  cefTRIAXone (ROCEPHIN) 1 g in sodium chloride 0.9 % 100 mL IVPB     06/11/19 2359   06/05/19 2000  remdesivir 200 mg in sodium chloride 0.9 % 250 mL IVPB     06/05/19 2138   06/05/19 1400  cefTRIAXone (ROCEPHIN) 1 g in sodium chloride 0.9 % 100 mL IVPB     06/05/19 1503   06/05/19 1400  azithromycin (ZITHROMAX) 500 mg in sodium chloride 0.9 % 250 mL IVPB     06/05/19 1503       Devices   LINES / TUBES:  #7.5 cuffed ETT 8/8>> 06/13/2019 RIGHT chest tube insertion 8/8>>   Continuous Infusions: . sodium chloride Stopped (06/07/19 1658)  . sodium chloride 10 mL/hr at 06/14/19 0800     Objective: Vitals:   06/14/19 0300 06/14/19 0400 06/14/19 0500 06/14/19 0854  BP: 122/76 136/87 123/81 117/73  Pulse: (!) 57 75 62 (!) 58  Resp: (!) 21 (!) 33 (!) 30 (!) 34  Temp:    98.6 F (37 C)  TempSrc:    Oral  SpO2: 96% 92% 91% 94%  Weight:      Height:        Intake/Output Summary (Last 24 hours) at 06/14/2019 1536 Last data filed at 06/14/2019 0908 Gross per 24 hour  Intake 769.3 ml  Output 1670 ml  Net -900.7 ml   Filed Weights   06/10/19 0500 06/12/19 0500 06/13/19 0500  Weight: 76.5 kg 69.9 kg 77 kg    Examination:  General: Alert, follows commands, positive acute respiratory distress Eyes: negative scleral hemorrhage, negative anisocoria, negative icterus ENT: Negative Runny nose, negative gingival bleeding, # 7.5 ETT in place negative signs of bleeding or infection Neck:  Negative scars, masses, torticollis, lymphadenopathy, JVD Lungs: Clear to auscultation bilaterally without wheezes or crackles Cardiovascular: Regular rate and rhythm without murmur gallop or rub normal S1 and S2 Abdomen: negative abdominal pain, nondistended, positive soft, bowel sounds, no rebound, no ascites, no appreciable mass Extremities: No significant cyanosis, clubbing, or edema bilateral lower extremities Skin: Negative rashes, lesions,  ulcers Psychiatric:  Negative depression, negative anxiety, negative fatigue, negative mania  Central nervous system:  Cranial nerves II through XII intact, tongue/uvula midline, all extremities muscle strength 5/5, sensation intact throughout, negative dysarthria, negative expressive aphasia, negative receptive aphasia.  Data Reviewed: Care during the described time interval was provided by me .  I have reviewed this patient's available data, including medical history, events of note, physical examination, and all test results as part of my evaluation.  CBC: Recent Labs  Lab 06/09/19 0337  06/10/19 0350  06/11/19 0647 06/11/19 0925 06/12/19 0323 06/12/19 0519 06/13/19 0445 06/14/19 0415  WBC 7.0  --  10.5  --  10.2  --   --  8.4 6.4 6.5  NEUTROABS 5.7  --   --   --  8.0*  --   --  5.8 4.1 4.3  HGB 12.0*   < > 13.1   < >  12.3* 12.9* 10.5* 11.9* 11.4* 12.8*  HCT 38.1*   < > 43.5   < > 41.7 38.0* 31.0* 40.2 38.2* 40.7  MCV 104.4*  --  106.6*  --  108.0*  --   --  108.4* 106.7* 101.8*  PLT 191  --  222  --  212  --   --  215 245 248   < > = values in this interval not displayed.   Basic Metabolic Panel: Recent Labs  Lab 06/10/19 1215  06/11/19 0647 06/11/19 0925 06/12/19 0323 06/12/19 0519 06/13/19 0445 06/14/19 0415  NA 145   < > 146* 146* 150* 144 141 137  K 4.6   < > 4.0 4.1 3.0* 4.2 3.8 3.8  CL 108  --  103  --   --  102 104 101  CO2 29  --  36*  --   --  34* 30 29  GLUCOSE 128*  --  89  --   --  107* 115* 75  BUN 30*  --  33*  --   --  32* 27* 31*  CREATININE 0.55*  --  0.62  --   --  0.71 0.52* 0.53*  CALCIUM 7.7*  --  7.9*  --   --  7.9* 7.9* 8.2*  MG 2.0  --  1.9  --   --  2.2 1.9 1.6*  PHOS 4.1  --  3.6  --   --  3.8 3.4 3.4   < > = values in this interval not displayed.   GFR: Estimated Creatinine Clearance: 100.5 mL/min (A) (by C-G formula based on SCr of 0.53 mg/dL (L)). Liver Function Tests: Recent Labs  Lab 06/10/19 1215 06/11/19 0647 06/12/19 0519  06/13/19 0445 06/14/19 0415  AST 174* 78* 48* 42* 60*  ALT 72* 62* 49* 49* 51*  ALKPHOS 78 76 74 71 69  BILITOT 0.8 0.6 0.9 0.4 1.1  PROT 5.4* 5.6* 5.8* 5.4* 6.1*  ALBUMIN 2.0* 2.0* 2.0* 1.9* 2.2*   No results for input(s): LIPASE, AMYLASE in the last 168 hours. No results for input(s): AMMONIA in the last 168 hours. Coagulation Profile: No results for input(s): INR, PROTIME in the last 168 hours. Cardiac Enzymes: Recent Labs  Lab 06/10/19 1215 06/11/19 0647 06/12/19 0519 06/13/19 0445 06/14/19 0415  CKTOTAL 92 45* 27* 22* 35*   BNP (last 3 results) No results for input(s): PROBNP in the last 8760 hours. HbA1C: No results for input(s): HGBA1C in the last 72 hours. CBG: Recent Labs  Lab 06/13/19 2027 06/13/19 2351 06/14/19 0404 06/14/19 0747 06/14/19 1151  GLUCAP 92 100* 74 85 110*   Lipid Profile: Recent Labs    06/12/19 0546 06/13/19 0445  TRIG 124 144   Thyroid Function Tests: No results for input(s): TSH, T4TOTAL, FREET4, T3FREE, THYROIDAB in the last 72 hours. Anemia Panel: Recent Labs    06/13/19 0445 06/14/19 0415  FERRITIN 325 389*   Urine analysis:    Component Value Date/Time   COLORURINE YELLOW 06/05/2019 1459   APPEARANCEUR CLEAR 06/05/2019 1459   LABSPEC 1.011 06/05/2019 1459   PHURINE 6.0 06/05/2019 1459   GLUCOSEU NEGATIVE 06/05/2019 1459   HGBUR NEGATIVE 06/05/2019 1459   BILIRUBINUR NEGATIVE 06/05/2019 1459   KETONESUR 5 (A) 06/05/2019 1459   PROTEINUR NEGATIVE 06/05/2019 1459   NITRITE NEGATIVE 06/05/2019 1459   LEUKOCYTESUR NEGATIVE 06/05/2019 1459   Sepsis Labs:  Recent Results (from the past 240 hour(s))  Blood Culture (routine x 2)  Status: None   Collection Time: 06/05/19  1:30 PM   Specimen: BLOOD  Result Value Ref Range Status   Specimen Description BLOOD RIGHT ANTECUBITAL  Final   Special Requests   Final    BOTTLES DRAWN AEROBIC AND ANAEROBIC Blood Culture results may not be optimal due to an inadequate volume  of blood received in culture bottles   Culture   Final    NO GROWTH 5 DAYS Performed at [facility], [place] [address], [place], [place] [address]    Report Status 06/10/2019 FINAL  Final  Blood Culture (routine x 2)     Status: None   Collection Time: 06/05/19  1:54 PM   Specimen: BLOOD LEFT HAND  Result Value Ref Range Status   Specimen Description BLOOD LEFT HAND  Final   Special Requests   Final    BOTTLES DRAWN AEROBIC AND ANAEROBIC Blood Culture results may not be optimal due to an inadequate volume of blood received in culture bottles   Culture   Final    NO GROWTH 5 DAYS Performed at [facility], [place] [address], [place], [place] [address]    Report Status 06/10/2019 FINAL  Final  Urine culture     Status: None   Collection Time: 06/05/19  2:59 PM   Specimen: In/Out Cath Urine  Result Value Ref Range Status   Specimen Description IN/OUT CATH URINE  Final   Special Requests NONE  Final   Culture   Final    NO GROWTH Performed at [facility], [place] [address] [place], [place] [address]    Report Status 06/06/2019 FINAL  Final  MRSA PCR Screening     Status: None   Collection Time: 06/09/19  3:50 PM   Specimen: Nasopharyngeal  Result Value Ref Range Status   MRSA by PCR NEGATIVE NEGATIVE Final    Comment:        The GeneXpert MRSA Assay (FDA approved for NASAL specimens only), is one component of a comprehensive MRSA colonization surveillance program. It is not intended to diagnose MRSA infection nor to guide or monitor treatment for MRSA infections. Performed at [facility], [place] [address], [place], [place] [address]   Culture, respiratory (non-expectorated)     Status: None   Collection Time: 06/10/19  5:09 AM   Specimen: Tracheal Aspirate; Respiratory  Result Value Ref Range Status   Specimen Description   Final    TRACHEAL ASPIRATE Performed at [place] [address], [place], [place] [address]    Special  Requests   Final    Normal Performed at [facility], [place] [address] [place], [place] [address]    Gram Stain   Final    FEW WBC PRESENT, PREDOMINANTLY PMN FEW GRAM POSITIVE COCCI IN PAIRS IN CLUSTERS RARE GRAM NEGATIVE RODS    Culture   Final    FEW STAPHYLOCOCCUS SPECIES (COAGULASE NEGATIVE) CALL MICROBIOLOGY LAB IF SENSITIVITIES ARE REQUIRED. Performed at [facility], [place] [address], [place],  [address]    Report Status 06/12/2019 FINAL  Final    Radiology Studies: Dg Chest Port 1 View  Result Date: 06/14/2019 CLINICAL DATA:  Acute respiratory failure. EXAM: PORTABLE CHEST 1 VIEW COMPARISON:  06/11/2019 FINDINGS: Interval removal of ET tube. Right-sided chest tube is in place. No pneumothorax. There is a right IJ catheter with tip projecting over the cavoatrial junction. Bilateral interstitial and airspace opacities are again noted, unchanged  from previous exam. IMPRESSION: 1. Multifocal bilateral pulmonary opacities are unchanged from previous exam. 2. Stable right chest tube and right IJ catheter. . Electronically Signed   By: Kerby Moors M.D.   On: 06/14/2019 11:39   Scheduled Meds: . chlorhexidine gluconate (MEDLINE KIT)  15 mL Mouth Rinse BID  . Chlorhexidine Gluconate Cloth  6 each Topical Daily  . cloNIDine  0.1 mg Oral BID  . [START ON 06/15/2019] cloNIDine  0.1 mg Oral Daily  . [START ON 06/15/2019] dexamethasone  6 mg Oral Daily  . dextromethorphan-guaiFENesin  1 tablet Oral BID  . docusate sodium  100 mg Oral BID  . [START ON 06/15/2019] enoxaparin (LOVENOX) injection  40 mg Subcutaneous Daily  . folic acid  1 mg Oral Daily  . insulin aspart  0-9 Units Subcutaneous Q4H  . mouth rinse  15 mL Mouth Rinse 10 times per day  . multivitamin  15 mL Oral Daily  . sodium chloride flush  3 mL Intravenous Q12H  . sodium chloride flush  3 mL Intravenous Q12H  . thiamine  100 mg Oral Daily   Continuous Infusions: . sodium chloride Stopped  (06/07/19 1658)  . sodium chloride 10 mL/hr at 06/14/19 0800     LOS: 9 days   Time spent: 40 minutes  Berle Mull, MD [facility]  If 7PM-7AM, please contact night-coverage [email or web address] Password [facility] 06/14/2019, 3:36 PM

## 2019-06-15 ENCOUNTER — Inpatient Hospital Stay (HOSPITAL_COMMUNITY): Payer: HRSA Program

## 2019-06-15 DIAGNOSIS — J988 Other specified respiratory disorders: Secondary | ICD-10-CM | POA: Diagnosis not present

## 2019-06-15 DIAGNOSIS — U071 COVID-19: Secondary | ICD-10-CM | POA: Diagnosis not present

## 2019-06-15 LAB — C-REACTIVE PROTEIN: CRP: 2.7 mg/dL — ABNORMAL HIGH (ref <1.0)

## 2019-06-15 LAB — CBC WITH DIFFERENTIAL/PLATELET
Abs Immature Granulocytes: 0.03 10*3/uL (ref 0.00–0.07)
Basophils Absolute: 0 10*3/uL (ref 0.0–0.1)
Basophils Relative: 0 %
Eosinophils Absolute: 0.3 10*3/uL (ref 0.0–0.5)
Eosinophils Relative: 4 %
HCT: 44.6 % (ref 39.0–52.0)
Hemoglobin: 14 g/dL (ref 13.0–17.0)
Immature Granulocytes: 1 %
Lymphocytes Relative: 19 %
Lymphs Abs: 1.2 10*3/uL (ref 0.7–4.0)
MCH: 31.6 pg (ref 26.0–34.0)
MCHC: 31.4 g/dL (ref 30.0–36.0)
MCV: 100.7 fL — ABNORMAL HIGH (ref 80.0–100.0)
Monocytes Absolute: 0.7 10*3/uL (ref 0.1–1.0)
Monocytes Relative: 10 %
Neutro Abs: 4.5 10*3/uL (ref 1.7–7.7)
Neutrophils Relative %: 66 %
Platelets: 256 10*3/uL (ref 150–400)
RBC: 4.43 MIL/uL (ref 4.22–5.81)
RDW: 12.8 % (ref 11.5–15.5)
WBC: 6.7 10*3/uL (ref 4.0–10.5)
nRBC: 0 % (ref 0.0–0.2)

## 2019-06-15 LAB — GLUCOSE, CAPILLARY
Glucose-Capillary: 87 mg/dL (ref 70–99)
Glucose-Capillary: 167 mg/dL — ABNORMAL HIGH (ref 70–99)
Glucose-Capillary: 125 mg/dL — ABNORMAL HIGH (ref 70–99)
Glucose-Capillary: 91 mg/dL (ref 70–99)

## 2019-06-15 LAB — FERRITIN: Ferritin: 491 ng/mL — ABNORMAL HIGH (ref 24–336)

## 2019-06-15 LAB — D-DIMER, QUANTITATIVE: D-Dimer, Quant: 3.59 ug/mL-FEU — ABNORMAL HIGH (ref 0.00–0.50)

## 2019-06-15 MED ORDER — ALBUTEROL SULFATE HFA 108 (90 BASE) MCG/ACT IN AERS
2.0000 | INHALATION_SPRAY | Freq: Four times a day (QID) | RESPIRATORY_TRACT | Status: DC | PRN
  Filled 2019-06-15: qty 6.7

## 2019-06-15 MED ORDER — MAGNESIUM SULFATE 2 GM/50ML IV SOLN
2.0000 g | Freq: Once | INTRAVENOUS | Status: AC
  Administered 2019-06-15: 2 g via INTRAVENOUS
  Filled 2019-06-15: qty 50

## 2019-06-15 MED ORDER — FENTANYL CITRATE (PF) 100 MCG/2ML IJ SOLN: 25.0000 ug | Freq: Three times a day (TID) | INTRAMUSCULAR | Status: DC | PRN

## 2019-06-15 MED ORDER — ENOXAPARIN SODIUM 40 MG/0.4ML ~~LOC~~ SOLN
40.0000 mg | SUBCUTANEOUS | Status: DC
  Administered 2019-06-15 – 2019-06-17 (×3): 40 mg via SUBCUTANEOUS
  Filled 2019-06-15 (×3): qty 0.4

## 2019-06-15 MED ORDER — ASPIRIN 81 MG PO CHEW
81.0000 mg | CHEWABLE_TABLET | Freq: Every day | ORAL | Status: DC
  Administered 2019-06-15 – 2019-06-17 (×3): 81 mg via ORAL
  Filled 2019-06-15 (×3): qty 1

## 2019-06-15 MED ORDER — HYDRALAZINE HCL 20 MG/ML IJ SOLN: 10.0000 mg | Freq: Four times a day (QID) | INTRAMUSCULAR | Status: DC | PRN

## 2019-06-15 MED ORDER — CLONAZEPAM 0.5 MG PO TBDP
0.5000 mg | ORAL_TABLET | Freq: Every day | ORAL | Status: DC
  Administered 2019-06-15 – 2019-06-17 (×3): 0.5 mg via ORAL
  Filled 2019-06-15 (×3): qty 1

## 2019-06-15 NOTE — Progress Notes (Signed)
   NAME:  Michael Clay, MRN:  [id number], DOB:  July 22, 1971, LOS: 23 ADMISSION DATE:  06/05/2019, CONSULTATION DATE:  06/05/2019 REFERRING MD:  [facility] , CHIEF COMPLAINT:  SOB  Brief History   [name] COVID-19 +, exhibiting increasing oxygen requirements in ED. intubated.  Developed right pneumothorax requiring chest tube placement.  Past Medical History  No known [facility] Events   8/6> admit to [facility] under hospitalist service  8/14 Extubated  Consults:  PCCM   Procedures:  06/07/2019 intubation >> 8/14 06/07/2019 right chest tube insertion 06/08/2019 chest tube repositioned  Significant Diagnostic Tests:  8/6 CXR> Diffuse bilateral pulmonary infiltrates   Micro Data:  7/25 SARS CoV2> positive  8/6 CTA on hold due to tenuous pulmonary status 06/07/2019 sputum>> 06/05/2019 urine culture negative 06/05/2019 blood cultures x2>>  Antimicrobials/COVID Rx  Azithromycin 8/6 >8/10 Rocephin 8/6 >8/12  Decadron 8/7 >> Remdesivir 8/6 > 8/10  Interim history/subjective:  Transfer out of ICU.  Stable respiratory status.  Objective   Blood pressure (!) 93/58, pulse 61, temperature 98.9 F (37.2 C), temperature source Oral, resp. rate (!) 30, height 5\' 2"  (1.575 m), weight 77.2 kg, SpO2 99 %.        Intake/Output Summary (Last 24 hours) at 06/15/2019 1153 Last data filed at 06/15/2019 0357 Gross per 24 hour  Intake 60 ml  Output 1550 ml  Net -1490 ml   Filed Weights   06/12/19 0500 06/13/19 0500 06/15/19 0500  Weight: 69.9 kg 77 kg 77.2 kg   Physical Exam: Gen:      No acute distress HEENT:  EOMI, sclera anicteric Neck:     No masses; no thyromegaly Lungs:    Clear to auscultation bilaterally; normal respiratory effort CV:         Regular rate and rhythm; no murmurs Abd:      + bowel sounds; soft, non-tender; no palpable masses, no distension Ext:    No edema; adequate peripheral perfusion Skin:      Warm and dry; no rash Neuro: alert and oriented x 3 Psych:  normal mood and affect  Resolved Hospital Problem list     Assessment & Plan:  ARDS due to COVID-19 pneumonia: C/b right PTX S/p remdesivir. Did not receive Actemra prior to transfer to [facility] however will defer at this time due to improving oxygenation and CRP <10 at time of transfer. Plan Stable post extubation.  Wean down oxygen   Right pneumothorax s/p chest tube No pneumothorax noted today We will discontinue chest tube  Marshell Garfinkel MD [facility] Pager [phone] If no answer call [phone] 06/15/2019, 11:53 AM

## 2019-06-15 NOTE — Progress Notes (Signed)
PROGRESS NOTE                                                                                                                                                                                                             Patient Demographics:    Michael Clay, is a 48 y.o. male, DOB - 1970/12/14, WPY:[id number]  Outpatient Primary MD for the patient is Patient, No Pcp Per    LOS - 10  Admit date - 06/05/2019    Chief Complaint  Patient presents with   Shortness of Breath       Brief Narrative   Michael Clay a 48 y.o. Hispanic male PMHx ETOH abuse -presented to the hospital with severe hypoxia requiring nonrebreather mask, in the ER he was diagnosed with COVID-19 pneumonitis causing acute hypoxic respiratory failure and he was subsequently admitted to the hospital, debated, he was treated with IV Remdisvir and Solu-Medrol.  Unfortunately he developed a right-sided tension pneumothorax requiring a chest tube placement.  He was in the ICU stabilized extubated and transferred to my care on 06/15/2019.  Day 10 of his hospitalization.   Subjective:    Michael Clay today has, No headache, No chest pain, No abdominal pain - No Nausea, No new weakness tingling or numbness, No Cough - SOB.     Assessment  & Plan :    Acute Hypoxic Resp. Failure due to Acute Covid 19 Viral Pneumonitis during the ongoing 2020 Covid 19 Pandemic - he was treated in the ICU with IV steroids and Remdisvir combination, he did not receive Actemra.  He was intubated.  Care was complicated by development of right-sided acute pneumothorax requiring chest tube placement.  He is now stable on oral steroid, on 2 L nasal cannula oxygen, pulmonary critical care is managing chest tube.  Will advance activity continue steroid taper.  Prepare for discharge in the next 1 to 2 days.     ABG     Component Value Date/Time   PHART 7.439 06/12/2019 0323   PCO2ART 41.4  06/12/2019 0323   PO2ART 51.0 (L) 06/12/2019 0323   HCO3 28.0 06/12/2019 0323   TCO2 29 06/12/2019 0323   ACIDBASEDEF 2.0 06/08/2019 0213   O2SAT 87.0 06/12/2019 0323    COVID-19 Labs  Recent Labs    06/13/19 0445 06/14/19 0415 06/15/19 0135  DDIMER 1.73*  3.17* 3.59*  FERRITIN 325 389* 491*  CRP 6.8* 4.0* 2.7*    Lab Results  Component Value Date   SARSCOV2NAA DETECTED (A) 05/24/2019     Hepatic Function Latest Ref Rng & Units 06/14/2019 06/13/2019 06/12/2019  Total Protein 6.5 - 8.1 g/dL 6.1(L) 5.4(L) 5.8(L)  Albumin 3.5 - 5.0 g/dL 2.2(L) 1.9(L) 2.0(L)  AST 15 - 41 U/L 60(H) 42(H) 48(H)  ALT 0 - 44 U/L 51(H) 49(H) 49(H)  Alk Phosphatase 38 - 126 U/L 69 71 74  Total Bilirubin 0.3 - 1.2 mg/dL 1.1 0.4 0.9        Component Value Date/Time   BNP 380.9 (H) 06/05/2019 1635      2.  History of alcohol abuse.  In the hospital for 10 days, no signs of DTs, placed on folic acid and thiamine, counseled to quit alcohol.  3. CAP at the time of admission.  Has been treated.  4.  Right-sided tension pneumothorax.  See #1 above.  5.  Mild transaminitis.  AST more than ALT.  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  Stable no symptoms repeat CMP in 7 to 10 days post discharge by PCP.    Condition - Extremely Guarded  Family Communication  :  None  Code Status :  Full  Diet :   Diet Order            Diet regular Room service appropriate? Yes; Fluid consistency: Thin  Diet effective now               Disposition Plan  :  Home 1-2 days  Consults  :  PCCM  Procedures  :     PUD Prophylaxis :    DVT Prophylaxis  :  Lovenox   Lab Results  Component Value Date   PLT 256 06/15/2019    Inpatient Medications  Scheduled Meds:  aspirin  81 mg Oral Daily   Chlorhexidine Gluconate Cloth  6 each Topical Daily   clonazepam  0.5 mg Oral Daily   dexamethasone  6 mg Oral Daily    dextromethorphan-guaiFENesin  1 tablet Oral BID   docusate sodium  100 mg Oral BID   enoxaparin (LOVENOX) injection  40 mg Subcutaneous K09F   folic acid  1 mg Oral Daily   insulin aspart  0-9 Units Subcutaneous TID AC & HS   mouth rinse  15 mL Mouth Rinse 10 times per day   multivitamin  15 mL Oral Daily   thiamine  100 mg Oral Daily   Continuous Infusions: PRN Meds:.acetaminophen, albuterol, bisacodyl, fentaNYL (SUBLIMAZE) injection, hydrALAZINE, [DISCONTINUED] ondansetron **OR** ondansetron (ZOFRAN) IV, polyethylene glycol  Antibiotics  :    Anti-infectives (From admission, onward)   Start     Dose/Rate Route Frequency Ordered Stop   06/06/19 2000  remdesivir 100 mg in sodium chloride 0.9 % 250 mL IVPB     100 mg 500 mL/hr over 30 Minutes Intravenous Every 24 hours 06/05/19 1753 06/09/19 2020   06/06/19 1400  azithromycin (ZITHROMAX) 500 mg in sodium chloride 0.9 % 250 mL IVPB     500 mg 250 mL/hr over 60 Minutes Intravenous Every 24 hours 06/05/19 1910 06/09/19 1625   06/06/19 1200  cefTRIAXone (ROCEPHIN) 1 g in sodium chloride 0.9 % 100 mL IVPB     1 g 200 mL/hr over 30 Minutes Intravenous Every 24 hours 06/05/19 1910 06/11/19 1257   06/05/19 2000  remdesivir 200 mg in sodium  chloride 0.9 % 250 mL IVPB     200 mg 500 mL/hr over 30 Minutes Intravenous Once 06/05/19 1753 06/05/19 2138   06/05/19 1400  cefTRIAXone (ROCEPHIN) 1 g in sodium chloride 0.9 % 100 mL IVPB     1 g 200 mL/hr over 30 Minutes Intravenous  Once 06/05/19 1355 06/05/19 1503   06/05/19 1400  azithromycin (ZITHROMAX) 500 mg in sodium chloride 0.9 % 250 mL IVPB     500 mg 250 mL/hr over 60 Minutes Intravenous  Once 06/05/19 1355 06/05/19 1503       Time Spent in minutes  30   Lala Lund M.D on 06/15/2019 at 10:20 AM  To page go to [email or web address] - password [facility]  [facility] -  Office  [phone]  See all Orders from today for further details    Objective:   Vitals:   06/15/19  0355 06/15/19 0400 06/15/19 0500 06/15/19 0753  BP:  (!) 93/58    Pulse:  63 61   Resp:  (!) 29 (!) 30   Temp: 99.1 F (37.3 C)   98.9 F (37.2 C)  TempSrc: Oral   Oral  SpO2:  96% 99%   Weight:   77.2 kg   Height:        Wt Readings from Last 3 Encounters:  06/15/19 77.2 kg     Intake/Output Summary (Last 24 hours) at 06/15/2019 1020 Last data filed at 06/15/2019 0357 Gross per 24 hour  Intake 60 ml  Output 1550 ml  Net -1490 ml     Physical Exam  Awake Alert, Oriented X 3, No new F.N deficits, Normal affect [place].AT,PERRAL Supple Neck,No JVD, No cervical lymphadenopathy appriciated.  Symmetrical Chest wall movement, Good air movement bilaterally, CTAB, right-sided chest tube in place. RRR,No Gallops,Rubs or new Murmurs, No Parasternal Heave +ve B.Sounds, Abd Soft, No tenderness, No organomegaly appriciated, No rebound - guarding or rigidity. No Cyanosis, Clubbing or edema, No new Rash or bruise     Data Review:    CBC Recent Labs  Lab 06/11/19 0647  06/12/19 0323 06/12/19 0519 06/13/19 0445 06/14/19 0415 06/15/19 0135  WBC 10.2  --   --  8.4 6.4 6.5 6.7  HGB 12.3*   < > 10.5* 11.9* 11.4* 12.8* 14.0  HCT 41.7   < > 31.0* 40.2 38.2* 40.7 44.6  PLT 212  --   --  215 245 248 256  MCV 108.0*  --   --  108.4* 106.7* 101.8* 100.7*  MCH 31.9  --   --  32.1 31.8 32.0 31.6  MCHC 29.5*  --   --  29.6* 29.8* 31.4 31.4  RDW 14.2  --   --  14.1 13.4 12.9 12.8  LYMPHSABS 1.0  --   --  1.2 1.1 1.2 1.2  MONOABS 1.0  --   --  1.1* 0.9 0.8 0.7  EOSABS 0.2  --   --  0.2 0.2 0.2 0.3  BASOSABS 0.0  --   --  0.0 0.0 0.0 0.0   < > = values in this interval not displayed.    Chemistries  Recent Labs  Lab 06/10/19 1215  06/11/19 0647 06/11/19 0925 06/12/19 0323 06/12/19 0519 06/13/19 0445 06/14/19 0415  NA 145   < > 146* 146* 150* 144 141 137  K 4.6   < > 4.0 4.1 3.0* 4.2 3.8 3.8  CL 108  --  103  --   --  102 104 101  CO2 29  --  36*  --   --  34* 30 29  GLUCOSE  128*  --  89  --   --  107* 115* 75  BUN 30*  --  33*  --   --  32* 27* 31*  CREATININE 0.55*  --  0.62  --   --  0.71 0.52* 0.53*  CALCIUM 7.7*  --  7.9*  --   --  7.9* 7.9* 8.2*  MG 2.0  --  1.9  --   --  2.2 1.9 1.6*  AST 174*  --  78*  --   --  48* 42* 60*  ALT 72*  --  62*  --   --  49* 49* 51*  ALKPHOS 78  --  76  --   --  74 71 69  BILITOT 0.8  --  0.6  --   --  0.9 0.4 1.1   < > = values in this interval not displayed.   ------------------------------------------------------------------------------------------------------------------ Recent Labs    06/13/19 0445  TRIG 144    Lab Results  Component Value Date   HGBA1C 5.8 (H) 06/08/2019   ------------------------------------------------------------------------------------------------------------------ No results for input(s): TSH, T4TOTAL, T3FREE, THYROIDAB in the last 72 hours.  Invalid input(s): FREET3  Cardiac Enzymes No results for input(s): CKMB, TROPONINI, MYOGLOBIN in the last 168 hours.  Invalid input(s): CK ------------------------------------------------------------------------------------------------------------------    Component Value Date/Time   BNP 380.9 (H) 06/05/2019 1635    Micro Results Recent Results (from the past 240 hour(s))  Blood Culture (routine x 2)     Status: None   Collection Time: 06/05/19  1:30 PM   Specimen: BLOOD  Result Value Ref Range Status   Specimen Description BLOOD RIGHT ANTECUBITAL  Final   Special Requests   Final    BOTTLES DRAWN AEROBIC AND ANAEROBIC Blood Culture results may not be optimal due to an inadequate volume of blood received in culture bottles   Culture   Final    NO GROWTH 5 DAYS Performed at [facility], [place] [address], [place], [place] [address]    Report Status 06/10/2019 FINAL  Final  Blood Culture (routine x 2)     Status: None   Collection Time: 06/05/19  1:54 PM   Specimen: BLOOD LEFT HAND  Result Value Ref Range Status   Specimen  Description BLOOD LEFT HAND  Final   Special Requests   Final    BOTTLES DRAWN AEROBIC AND ANAEROBIC Blood Culture results may not be optimal due to an inadequate volume of blood received in culture bottles   Culture   Final    NO GROWTH 5 DAYS Performed at [facility], [place] [address], [place], [place] [address]    Report Status 06/10/2019 FINAL  Final  Urine culture     Status: None   Collection Time: 06/05/19  2:59 PM   Specimen: In/Out Cath Urine  Result Value Ref Range Status   Specimen Description IN/OUT CATH URINE  Final   Special Requests NONE  Final   Culture   Final    NO GROWTH Performed at [facility], [place] [address], [place], [place] [address]    Report Status 06/06/2019 FINAL  Final  MRSA PCR Screening     Status: None   Collection Time: 06/09/19  3:50 PM   Specimen: Nasopharyngeal  Result Value Ref Range Status   MRSA by PCR NEGATIVE NEGATIVE Final    Comment:  The GeneXpert MRSA Assay (FDA approved for NASAL specimens only), is one component of a comprehensive MRSA colonization surveillance program. It is not intended to diagnose MRSA infection nor to guide or monitor treatment for MRSA infections. Performed at [facility], [place] [address] [place], [place] [address]   Culture, respiratory (non-expectorated)     Status: None   Collection Time: 06/10/19  5:09 AM   Specimen: Tracheal Aspirate; Respiratory  Result Value Ref Range Status   Specimen Description   Final    TRACHEAL ASPIRATE Performed at [facility] [address], [place], [place] [address]    Special Requests   Final    Normal Performed at [facility], [place] [address] [place], [place] [address]    Gram Stain   Final    FEW WBC PRESENT, PREDOMINANTLY PMN FEW GRAM POSITIVE COCCI IN PAIRS IN CLUSTERS RARE GRAM NEGATIVE RODS    Culture   Final    FEW STAPHYLOCOCCUS SPECIES (COAGULASE NEGATIVE) CALL MICROBIOLOGY LAB IF  SENSITIVITIES ARE REQUIRED. Performed at [facility], [place] [address], [place], [place] [address]    Report Status 06/12/2019 FINAL  Final    Radiology Reports Dg Chest Port 1 View  Result Date: 06/15/2019 CLINICAL DATA:  COVID-19 EXAM: PORTABLE CHEST 1 VIEW COMPARISON:  Yesterday FINDINGS: Right IJ line has been removed. Stable low volume chest with patchy bilateral airspace disease. Stable chest tube on the right with no visible pneumothorax. IMPRESSION: 1. Stable pneumonia/ARDS. 2. Right chest tube with no visible pneumothorax. Electronically Signed   By: Monte Fantasia M.D.   On: 06/15/2019 07:35   Dg Chest Port 1 View  Result Date: 06/14/2019 CLINICAL DATA:  Acute respiratory failure. EXAM: PORTABLE CHEST 1 VIEW COMPARISON:  06/11/2019 FINDINGS: Interval removal of ET tube. Right-sided chest tube is in place. No pneumothorax. There is a right IJ catheter with tip projecting over the cavoatrial junction. Bilateral interstitial and airspace opacities are again noted, unchanged from previous exam. IMPRESSION: 1. Multifocal bilateral pulmonary opacities are unchanged from previous exam. 2. Stable right chest tube and right IJ catheter. . Electronically Signed   By: Kerby Moors M.D.   On: 06/14/2019 11:39   Dg Chest Port 1 View  Result Date: 06/11/2019 CLINICAL DATA:  Chest tube EXAM: PORTABLE CHEST 1 VIEW COMPARISON:  Yesterday FINDINGS: Endotracheal tube tip between the clavicular heads and carina. The orogastric tube tip at least reaches the distal stomach. Right IJ line with tip at the SVC. Right chest tube in stable lateral position. ARDS/viral pneumonia appearance. Stable inflation. No visible pneumothorax. Stable heart size. IMPRESSION: Stable hardware positioning and airspace disease. No visible pneumothorax. Electronically Signed   By: Monte Fantasia M.D.   On: 06/11/2019 08:57   Dg Chest Port 1 View  Result Date: 06/10/2019 CLINICAL DATA:  Endotracheal tube placement. EXAM:  PORTABLE CHEST 1 VIEW COMPARISON:  Radiograph of June 09, 2019. FINDINGS: Stable cardiomediastinal silhouette. Stable position of endotracheal and nasogastric tubes. Stable right internal jugular catheter is noted. Stable position of right-sided chest tube. No definite pneumothorax is noted. Stable bilateral lung opacities are noted consistent with multifocal pneumonia. No significant pleural effusion is noted. Bony thorax is unremarkable. IMPRESSION: Stable support apparatus. Stable position of right-sided chest tube without definite pneumothorax. Stable bilateral lung opacities as described above. Electronically Signed   By: Marijo Conception M.D.   On: 06/10/2019 08:56   Dg Chest Port 1 View  Result Date: 06/09/2019  CLINICAL DATA:  Respiratory failure. EXAM: PORTABLE CHEST 1 VIEW COMPARISON:  Radiograph of June 08, 2019. FINDINGS: The heart size and mediastinal contours are within normal limits. Endotracheal and nasogastric tubes are unchanged in position. Right internal jugular catheter is unchanged in position. Right-sided chest tube is unchanged in position. No definite pneumothorax is noted. Subcutaneous emphysema is seen over right lateral chest wall has resolved. Stable bilateral lung opacities are noted. The visualized skeletal structures are unremarkable. IMPRESSION: Stable support apparatus. Stable position of right-sided chest tube without definite pneumothorax. Stable bilateral lung opacities as described above. Electronically Signed   By: Marijo Conception M.D.   On: 06/09/2019 07:56   Dg Chest Port 1 View  Result Date: 06/08/2019 CLINICAL DATA:  COVID-19 pneumonia, follow-up pneumothorax, respiratory failure EXAM: PORTABLE CHEST 1 VIEW COMPARISON:  Chest radiograph from one day prior. FINDINGS: Endotracheal tube tip is 2.9 cm above the carina. Enteric tube terminates in the distal body of the stomach. Right chest tube terminates in medial right hemithorax. Right internal jugular central venous  catheter terminates in the upper third of the SVC. Stable cardiomediastinal silhouette with normal heart size. Large right pneumothorax is significantly increased. No left pneumothorax. No pleural effusion. No significant left mediastinal shift. Severe patchy opacity throughout the left lung is unchanged, with near complete right lung atelectasis. Increased subcutaneous emphysema in the lateral right chest wall. IMPRESSION: 1. Large right pneumothorax, significantly increased despite right chest tube. A subsequent chest radiograph has already been obtained by the time of this dictation. 2. Severe patchy opacity throughout the left lung, compatible with COVID-19 pneumonia. Near complete right lung atelectasis. 3. Well-positioned support structures. Electronically Signed   By: Ilona Sorrel M.D.   On: 06/08/2019 07:59   Dg Chest Port 1 View  Result Date: 06/08/2019 CLINICAL DATA:  COVID-19 pneumonia, pneumothorax EXAM: PORTABLE CHEST 1 VIEW COMPARISON:  Chest radiograph from one day prior. FINDINGS: Right internal jugular central venous catheter terminates in the middle third of the SVC. Endotracheal tube tip is 3.2 cm above the carina. Enteric tube enters stomach with the tip not seen on this image. Right chest tube terminates in the peripheral mid to upper right pleural space. Stable cardiomediastinal silhouette with normal heart size. Tiny right apical pneumothorax is substantially decreased. No left pneumothorax. No pleural effusion. Severe patchy opacity throughout the periphery of both lungs, not appreciably changed, with improved aeration of the right lung. IMPRESSION: 1. Tiny right apical pneumothorax, substantially decreased, with right chest tube in place. 2. Well-positioned support structures. 3. Stable severe patchy opacity throughout both lungs, compatible with known COVID-19 pneumonia. Electronically Signed   By: Ilona Sorrel M.D.   On: 06/08/2019 07:56   Dg Chest Port 1 View  Result Date:  06/07/2019 CLINICAL DATA:  Respiratory failure due to COVID-19 pneumonia. Central line placement. EXAM: PORTABLE CHEST 1 VIEW COMPARISON:  Prior study at 1021 hours FINDINGS: Endotracheal tube tip now projects at the carina. New right jugular central line with the catheter tip in the lower SVC. Stable small right lateral and apically pneumothorax. Stable positioning of right-sided pigtail catheter extending to midline. Stable severe bilateral airspace pneumonia with peripheral predominance consistent with COVID-19 pneumonia. No pleural fluid. IMPRESSION: Newly placed right jugular central line tip is in the lower SVC. The endotracheal tube projects at the carina on the current chest x-ray. Stable bilateral airspace disease and stable small residual right pneumothorax. Electronically Signed   By: Aletta Edouard M.D.   On: 06/07/2019 14:45  Dg Chest Port 1 View  Addendum Date: 06/07/2019   ADDENDUM REPORT: 06/07/2019 12:25 ADDENDUM: These results were called by telephone at the time of interpretation on 06/07/2019 at 11:15 pm to NP [place] , who verbally acknowledged these results. Electronically Signed   By: Ilona Sorrel M.D.   On: 06/07/2019 12:25   Result Date: 06/07/2019 CLINICAL DATA:  COVID-19 pneumonia.  Chest tube, intubated EXAM: PORTABLE CHEST 1 VIEW COMPARISON:  Chest radiograph from earlier today. FINDINGS: Endotracheal tube tip is at the carina, overlying the right mainstem bronchus. Enteric tube enters stomach with the tip not seen on this image. Stable right pigtail chest tube. Stable cardiomediastinal silhouette with normal heart size. Small right apical pneumothorax is stable. No left pneumothorax. No pleural effusion. Severe patchy consolidation throughout both lungs is stable. IMPRESSION: 1. Endotracheal tube tip at the carina overlying the right mainstem bronchus, recommend 3-4 cm retraction. 2. Well-positioned enteric tube. 3. Stable small right pneumothorax with right chest tube in  place. 4. Stable severe patchy consolidation in both lungs compatible with COVID-19 pneumonia. We are attempting to reach the ordering provider now. An addendum will be issued when the results are called to the provider. Electronically Signed: By: Ilona Sorrel M.D. On: 06/07/2019 11:02   Dg Chest Port 1 View  Result Date: 06/07/2019 CLINICAL DATA:  48 year old male with COVID-19 infection with recent right lung collapse. Status post chest tube placement. EXAM: PORTABLE CHEST 1 VIEW COMPARISON:  Chest x-ray 06/07/2019 at 7:32 a.m. FINDINGS: New right-sided small bore pigtail catheter with tip in the medial aspect of the right hemithorax. Interval re-expansion of the right lung with small to moderate residual right pneumothorax. Patchy multifocal interstitial and airspace disease again noted throughout the lungs bilaterally, compatible with multilobar pneumonia. No definite pleural effusions. Heart size is upper limits of normal. Upper mediastinal contours are within normal limits. An endotracheal tube is in place with tip 1.5 cm above the carina. Nasogastric tube is in position with tip in the stomach and side port at the gastroesophageal junction. IMPRESSION: 1. Support apparatus, as above. Endotracheal tube should be used drawn 3 cm for more optimal placement. Nasogastric tube could also be advanced another 5 cm for more optimal placement. 2. Small to moderate residual right pneumothorax with re-expanding right lung following chest tube placement. 3. Severe multilobar pneumonia redemonstrated, as above, compatible with COVID-19 infection. Electronically Signed   By: Vinnie Langton M.D.   On: 06/07/2019 09:13   Dg Chest Port 1 View  Result Date: 06/07/2019 CLINICAL DATA:  48 year old male with history of respiratory failure. COVID-19 infection. EXAM: PORTABLE CHEST 1 VIEW COMPARISON:  Chest x-ray 06/05/2019. FINDINGS: Patient is intubated with the tip of the endotracheal tube approximately 4.4 cm above the  carina. Nasogastric tube terminates at or immediately below the level of the gastroesophageal junction. New large pneumothorax in the right hemithorax with complete collapse of the right lung. Patchy multifocal interstitial and ill-defined airspace disease throughout the left lung with worsened aeration compared to the prior examination. Heart size is normal. Upper mediastinal contours are within normal limits. IMPRESSION: 1. New large right pneumothorax with complete collapse of the right lung. 2. Support apparatus, as above. Please take note of the high position of the nasogastric tube and consider advancement approximately 10 cm for more optimal placement. 3. Persistent multilobar pneumonia in the left lung with worsening aeration compared to the prior examination. Critical Value/emergent results were called by telephone at the time of interpretation on 06/07/2019  at 8:40 am to [facility] NP, who verbally acknowledged these results. Electronically Signed   By: Vinnie Langton M.D.   On: 06/07/2019 08:48   Dg Chest Port 1 View  Result Date: 06/05/2019 CLINICAL DATA:  Shortness of breath. COVID-19. Hypoxia. EXAM: PORTABLE CHEST 1 VIEW COMPARISON:  None. FINDINGS: There are extensive bilateral peripheral hazy pulmonary infiltrates. No effusions. The heart size and pulmonary vascularity are normal. No acute bone abnormality. IMPRESSION: Extensive bilateral pulmonary infiltrates. Electronically Signed   By: Lorriane Shire M.D.   On: 06/05/2019 13:36

## 2019-06-16 LAB — D-DIMER, QUANTITATIVE: D-Dimer, Quant: 3.44 ug/mL-FEU — ABNORMAL HIGH (ref 0.00–0.50)

## 2019-06-16 LAB — COMPREHENSIVE METABOLIC PANEL
ALT: 116 U/L — ABNORMAL HIGH (ref 0–44)
AST: 92 U/L — ABNORMAL HIGH (ref 15–41)
Albumin: 2.5 g/dL — ABNORMAL LOW (ref 3.5–5.0)
Alkaline Phosphatase: 80 U/L (ref 38–126)
Anion gap: 8 (ref 5–15)
BUN: 21 mg/dL — ABNORMAL HIGH (ref 6–20)
CO2: 23 mmol/L (ref 22–32)
Calcium: 8.2 mg/dL — ABNORMAL LOW (ref 8.9–10.3)
Chloride: 105 mmol/L (ref 98–111)
Creatinine, Ser: 0.41 mg/dL — ABNORMAL LOW (ref 0.61–1.24)
GFR calc Af Amer: >60 mL/min (ref >60)
GFR calc non Af Amer: >60 mL/min (ref >60)
Glucose, Bld: 72 mg/dL (ref 70–99)
Potassium: 3.6 mmol/L (ref 3.5–5.1)
Sodium: 136 mmol/L (ref 135–145)
Total Bilirubin: 0.8 mg/dL (ref 0.3–1.2)
Total Protein: 6.2 g/dL — ABNORMAL LOW (ref 6.5–8.1)

## 2019-06-16 LAB — GLUCOSE, CAPILLARY
Glucose-Capillary: 85 mg/dL (ref 70–99)
Glucose-Capillary: 129 mg/dL — ABNORMAL HIGH (ref 70–99)
Glucose-Capillary: 129 mg/dL — ABNORMAL HIGH (ref 70–99)
Glucose-Capillary: 131 mg/dL — ABNORMAL HIGH (ref 70–99)
Glucose-Capillary: 116 mg/dL — ABNORMAL HIGH (ref 70–99)

## 2019-06-16 LAB — CBC WITH DIFFERENTIAL/PLATELET
Abs Immature Granulocytes: 0.04 10*3/uL (ref 0.00–0.07)
Basophils Absolute: 0 10*3/uL (ref 0.0–0.1)
Basophils Relative: 0 %
Eosinophils Absolute: 0.2 10*3/uL (ref 0.0–0.5)
Eosinophils Relative: 4 %
HCT: 42.8 % (ref 39.0–52.0)
Hemoglobin: 13.8 g/dL (ref 13.0–17.0)
Immature Granulocytes: 1 %
Lymphocytes Relative: 26 %
Lymphs Abs: 1.6 10*3/uL (ref 0.7–4.0)
MCH: 32 pg (ref 26.0–34.0)
MCHC: 32.2 g/dL (ref 30.0–36.0)
MCV: 99.3 fL (ref 80.0–100.0)
Monocytes Absolute: 0.8 10*3/uL (ref 0.1–1.0)
Monocytes Relative: 13 %
Neutro Abs: 3.5 10*3/uL (ref 1.7–7.7)
Neutrophils Relative %: 56 %
Platelets: 273 10*3/uL (ref 150–400)
RBC: 4.31 MIL/uL (ref 4.22–5.81)
RDW: 13 % (ref 11.5–15.5)
WBC: 6.1 10*3/uL (ref 4.0–10.5)
nRBC: 0 % (ref 0.0–0.2)

## 2019-06-16 LAB — LACTATE DEHYDROGENASE: LDH: 244 U/L — ABNORMAL HIGH (ref 98–192)

## 2019-06-16 LAB — MAGNESIUM: Magnesium: 2 mg/dL (ref 1.7–2.4)

## 2019-06-16 LAB — FERRITIN: Ferritin: 495 ng/mL — ABNORMAL HIGH (ref 24–336)

## 2019-06-16 LAB — C-REACTIVE PROTEIN: CRP: 1.8 mg/dL — ABNORMAL HIGH (ref <1.0)

## 2019-06-16 LAB — BRAIN NATRIURETIC PEPTIDE: B Natriuretic Peptide: 42.1 pg/mL (ref 0.0–100.0)

## 2019-06-16 NOTE — Evaluation (Signed)
Physical Therapy Evaluation Patient Details Name: Michael Clay MRN: [id number] DOB: 09-18-1971 Today's Date: 06/16/2019   History of Present Illness  Michael Clay is a 48 y.o.  Hispanic male PMHx ETOH abuse  -presented to the hospital with severe hypoxia requiring nonrebreather mask, in the ER he was diagnosed with COVID-19 pneumonitis causing acute hypoxic respiratory failure and he was subsequently admitted to the hospital, debated, he was treated with IV Remdisvir and Solu-Medrol.  Unfortunately he developed a right-sided tension pneumothorax requiring a chest tube placement.  He was in the ICU stabilized extubated and transferred to my care on 06/15/2019.  Clinical Impression  The patient reports that he feels dizzy when getting up earlier today. Also reports discomfort in right lower quadrant, especially when coughing.  Orthostatic BP supine 92/63, sitting 130/118(pt moving arm), standing 83/50, after amb x 50' 89/59. Patient did not report feeling dizzy.  SaO2 difficulty in getting good reading.  On RA at rest 92%, ambulated on ?RA with SaO2 reading 85%(not good pleth). Replaced back on 2 liters and at 95%  The patient is mobilizing, should be checked again for O2 walk test prior to Dc.   the patient asking about having another  Xray to ensure he will not have to return to hospital. Defer to MD. Pt admitted with above diagnosis. [place] Pager [phone] Office [phone]  Pt currently with functional limitations due to the deficits listed below (see PT Problem List). Pt will benefit from skilled PT to increase their independence and safety with mobility to allow discharge to the venue listed below.       Follow Up Recommendations No PT follow up    Equipment Recommendations  (TBD, used a RW today)    Recommendations for Other Services       Precautions / Restrictions Precautions Precaution Comments: monitor sats and BP, was orthostatic a  little today      Mobility  Bed Mobility Overal bed mobility: Independent                Transfers Overall transfer level: Needs assistance Equipment used: Rolling walker (2 wheeled) Transfers: Sit to/from Stand Sit to Stand: Supervision         General transfer comment: no assistance required  Ambulation/Gait Ambulation/Gait assistance: Min guard Gait Distance (Feet): 50 Feet Assistive device: Rolling walker (2 wheeled) Gait Pattern/deviations: Step-through pattern Gait velocity: decr   General Gait Details: gait slow, SaO2 not picking up very well, amb on RA, possibly lowest 88%. HR 77  Stairs            Wheelchair Mobility    Modified Rankin (Stroke Patients Only)       Balance Overall balance assessment: Needs assistance   Sitting balance-Leahy Scale: Good     Standing balance support: Single extremity supported Standing balance-Leahy Scale: Fair Standing balance comment: standing for BP                             Pertinent Vitals/Pain Pain Assessment: Faces Faces Pain Scale: Hurts little more Pain Location: right lower quadrant( did have a chest tube) Pain Descriptors / Indicators: Discomfort;Guarding Pain Intervention(s): Monitored during session    Home Living Family/patient expects to be discharged to:: Private residence   Available Help at Discharge: Family Type of Home: House Home Access: Stairs to enter   Technical brewer of Steps: 2-4 Home Layout: One level Home Equipment: None  Prior Function Level of Independence: Independent               Hand Dominance        Extremity/Trunk Assessment        Lower Extremity Assessment Lower Extremity Assessment: Generalized weakness    Cervical / Trunk Assessment Cervical / Trunk Assessment: Normal  Communication      Cognition Arousal/Alertness: Awake/alert Behavior During Therapy: WFL for tasks assessed/performed Overall Cognitive  Status: Difficult to assess                                 General Comments: pt was a little off on the day of the week      General Comments      Exercises     Assessment/Plan    PT Assessment Patient needs continued PT services  PT Problem List Decreased activity tolerance;Decreased mobility;Cardiopulmonary status limiting activity       PT Treatment Interventions DME instruction;Gait training;Functional mobility training;Therapeutic activities;Patient/family education    PT Goals (Current goals can be found in the Care Plan section)  Acute Rehab PT Goals Patient Stated Goal: go home to get food PT Goal Formulation: With patient Time For Goal Achievement: 06/30/19 Potential to Achieve Goals: Good    Frequency Min 3X/week   Barriers to discharge        Co-evaluation               AM-PAC PT "6 Clicks" Mobility  Outcome Measure Help needed turning from your back to your side while in a flat bed without using bedrails?: None Help needed moving from lying on your back to sitting on the side of a flat bed without using bedrails?: None Help needed moving to and from a bed to a chair (including a wheelchair)?: None Help needed standing up from a chair using your arms (e.g., wheelchair or bedside chair)?: None Help needed to walk in hospital room?: A Little Help needed climbing 3-5 steps with a railing? : A Little 6 Click Score: 22    End of Session Equipment Utilized During Treatment: Oxygen Activity Tolerance: Patient tolerated treatment well Patient left: in bed Nurse Communication: Mobility status PT Visit Diagnosis: Difficulty in walking, not elsewhere classified (R26.2)    Time: [id number] PT Time Calculation (min) (ACUTE ONLY): 53 min   Charges:   PT Evaluation $PT Eval Moderate Complexity: 1 Mod PT Treatments $Gait Training: 23-37 mins $Therapeutic Activity: 8-22 mins        Blanchard KelchKaren Aalaysia Liggins PT Acute Rehabilitation Services Pager  [phone] Office [phone]   Rada HayHill, Diogo Anne Elizabeth 06/16/2019, 1:32 PM

## 2019-06-16 NOTE — Progress Notes (Signed)
PROGRESS NOTE                                                                                                                                                                                                             Patient Demographics:    Michael Clay, is a 48 y.o. male, DOB - [birth year], ZOX:[id number]  Outpatient Primary MD for the patient is Patient, No Pcp Per    LOS - 11  Admit date - 06/05/2019    Chief Complaint  Patient presents with   Shortness of Breath       Brief Narrative   Michael Clay a 48 y.o. Hispanic male PMHx ETOH abuse -presented to the hospital with severe hypoxia requiring nonrebreather mask, in the ER he was diagnosed with COVID-19 pneumonitis causing acute hypoxic respiratory failure and he was subsequently admitted to the hospital, debated, he was treated with IV Remdisvir and Solu-Medrol.  Unfortunately he developed a right-sided tension pneumothorax requiring a chest tube placement.  He was in the ICU stabilized extubated and transferred to my care on 06/15/2019.  Day 10 of his hospitalization.   Subjective:    Patient in bed, appears comfortable, denies any headache, no fever, no chest pain or pressure, no shortness of breath , no abdominal pain. No focal weakness.   Assessment  & Plan :    Acute Hypoxic Resp. Failure due to Acute Covid 19 Viral Pneumonitis during the ongoing 2020 Covid 19 Pandemic - he was treated in the ICU with IV steroids and Remdisvir combination, he did not receive Actemra.  He was intubated.  Care was complicated by development of right-sided acute pneumothorax requiring chest tube placement, chest tube was removed 06/15/2019 without any complications.  He is now stable on oral steroid, steroid taper has been started, he will be titrated off of oxygen, advance activity prepare for discharge on 06/17/2019.  COVID-19 Labs  Recent Labs    06/14/19 0415 06/15/19 0135  06/16/19 0315  DDIMER 3.17* 3.59* 3.44*  FERRITIN 389* 491* 495*  LDH  --   --  244*  CRP 4.0* 2.7* 1.8*    Lab Results  Component Value Date   SARSCOV2NAA DETECTED (A) 05/24/2019     Hepatic Function Latest Ref Rng & Units 06/16/2019 06/14/2019 06/13/2019  Total Protein 6.5 - 8.1 g/dL 6.2(L) 6.1(L) 5.4(L)  Albumin  3.5 - 5.0 g/dL 2.5(L) 2.2(L) 1.9(L)  AST 15 - 41 U/L 92(H) 60(H) 42(H)  ALT 0 - 44 U/L 116(H) 51(H) 49(H)  Alk Phosphatase 38 - 126 U/L 80 69 71  Total Bilirubin 0.3 - 1.2 mg/dL 0.8 1.1 0.4        Component Value Date/Time   BNP 42.1 06/16/2019 0315      2.  History of alcohol abuse.  In the hospital for 10 days, no signs of DTs, placed on folic acid and thiamine, counseled to quit alcohol.  3. CAP at the time of admission.  Has been treated.  4.  Right-sided tension pneumothorax.  See #1 above.  5.  Mild transaminitis.  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  Stable no symptoms repeat CMP in 7 to 10 days post discharge by PCP.    Condition - Extremely Guarded  Family Communication  :  None  Code Status :  Full  Diet :   Diet Order            Diet regular Room service appropriate? Yes; Fluid consistency: Thin  Diet effective now               Disposition Plan  :  Home in am  Consults  :  PCCM  Procedures  :     PUD Prophylaxis :    DVT Prophylaxis  :  Lovenox  Lab Results  Component Value Date   PLT 273 06/16/2019    Inpatient Medications  Scheduled Meds:  aspirin  81 mg Oral Daily   Chlorhexidine Gluconate Cloth  6 each Topical Daily   clonazepam  0.5 mg Oral Daily   dexamethasone  6 mg Oral Daily   dextromethorphan-guaiFENesin  1 tablet Oral BID   docusate sodium  100 mg Oral BID   enoxaparin (LOVENOX) injection  40 mg Subcutaneous M03K   folic acid  1 mg Oral Daily   insulin aspart  0-9 Units Subcutaneous TID AC & HS   mouth rinse  15 mL Mouth Rinse 10  times per day   multivitamin  15 mL Oral Daily   thiamine  100 mg Oral Daily   Continuous Infusions: PRN Meds:.acetaminophen, albuterol, bisacodyl, fentaNYL (SUBLIMAZE) injection, hydrALAZINE, [DISCONTINUED] ondansetron **OR** ondansetron (ZOFRAN) IV, polyethylene glycol  Antibiotics  :    Anti-infectives (From admission, onward)   Start     Dose/Rate Route Frequency Ordered Stop   06/06/19 2000  remdesivir 100 mg in sodium chloride 0.9 % 250 mL IVPB     100 mg 500 mL/hr over 30 Minutes Intravenous Every 24 hours 06/05/19 1753 06/09/19 2020   06/06/19 1400  azithromycin (ZITHROMAX) 500 mg in sodium chloride 0.9 % 250 mL IVPB     500 mg 250 mL/hr over 60 Minutes Intravenous Every 24 hours 06/05/19 1910 06/09/19 1625   06/06/19 1200  cefTRIAXone (ROCEPHIN) 1 g in sodium chloride 0.9 % 100 mL IVPB     1 g 200 mL/hr over 30 Minutes Intravenous Every 24 hours 06/05/19 1910 06/11/19 1257   06/05/19 2000  remdesivir 200 mg in sodium chloride 0.9 % 250 mL IVPB     200 mg 500 mL/hr over 30 Minutes Intravenous Once 06/05/19 1753 06/05/19 2138   06/05/19 1400  cefTRIAXone (ROCEPHIN) 1 g in sodium chloride 0.9 % 100 mL IVPB     1 g 200 mL/hr over 30 Minutes Intravenous  Once 06/05/19 1355 06/05/19 1503  06/05/19 1400  azithromycin (ZITHROMAX) 500 mg in sodium chloride 0.9 % 250 mL IVPB     500 mg 250 mL/hr over 60 Minutes Intravenous  Once 06/05/19 1355 06/05/19 1503       Time Spent in minutes  30   Lala Lund M.D on 06/16/2019 at 8:48 AM  To page go to [email or web address] - password [facility]  [facility] -  Office  [phone]  See all Orders from today for further details    Objective:   Vitals:   06/15/19 1945 06/16/19 0326 06/16/19 0455 06/16/19 0755  BP: 106/68 112/67  99/71  Pulse: (!) 58 (!) 59  71  Resp:      Temp: 98.3 F (36.8 C) 98.9 F (37.2 C)  99.2 F (37.3 C)  TempSrc: Oral Oral    SpO2: 100% 98%  90%  Weight:   77 kg   Height:        Wt Readings  from Last 3 Encounters:  06/16/19 77 kg     Intake/Output Summary (Last 24 hours) at 06/16/2019 0848 Last data filed at 06/16/2019 0230 Gross per 24 hour  Intake 45.18 ml  Output 750 ml  Net -704.82 ml     Physical Exam  Awake Alert, Oriented X 3, No new F.N deficits, Normal affect [place].AT,PERRAL Supple Neck,No JVD, No cervical lymphadenopathy appriciated.  Symmetrical Chest wall movement, Good air movement bilaterally, CTAB RRR,No Gallops, Rubs or new Murmurs, No Parasternal Heave +ve B.Sounds, Abd Soft, No tenderness, No organomegaly appriciated, No rebound - guarding or rigidity. No Cyanosis, Clubbing or edema, No new Rash or bruise    Data Review:    CBC Recent Labs  Lab 06/12/19 0519 06/13/19 0445 06/14/19 0415 06/15/19 0135 06/16/19 0315  WBC 8.4 6.4 6.5 6.7 6.1  HGB 11.9* 11.4* 12.8* 14.0 13.8  HCT 40.2 38.2* 40.7 44.6 42.8  PLT 215 245 248 256 273  MCV 108.4* 106.7* 101.8* 100.7* 99.3  MCH 32.1 31.8 32.0 31.6 32.0  MCHC 29.6* 29.8* 31.4 31.4 32.2  RDW 14.1 13.4 12.9 12.8 13.0  LYMPHSABS 1.2 1.1 1.2 1.2 1.6  MONOABS 1.1* 0.9 0.8 0.7 0.8  EOSABS 0.2 0.2 0.2 0.3 0.2  BASOSABS 0.0 0.0 0.0 0.0 0.0    Chemistries  Recent Labs  Lab 06/11/19 0647  06/12/19 0323 06/12/19 0519 06/13/19 0445 06/14/19 0415 06/16/19 0315  NA 146*   < > 150* 144 141 137 136  K 4.0   < > 3.0* 4.2 3.8 3.8 3.6  CL 103  --   --  102 104 101 105  CO2 36*  --   --  34* '30 29 23  ' GLUCOSE 89  --   --  107* 115* 75 72  BUN 33*  --   --  32* 27* 31* 21*  CREATININE 0.62  --   --  0.71 0.52* 0.53* 0.41*  CALCIUM 7.9*  --   --  7.9* 7.9* 8.2* 8.2*  MG 1.9  --   --  2.2 1.9 1.6* 2.0  AST 78*  --   --  48* 42* 60* 92*  ALT 62*  --   --  49* 49* 51* 116*  ALKPHOS 76  --   --  74 71 69 80  BILITOT 0.6  --   --  0.9 0.4 1.1 0.8   < > = values in this interval not displayed.    ------------------------------------------------------------------------------------------------------------------ No results for input(s): CHOL, HDL, LDLCALC, TRIG, CHOLHDL, LDLDIRECT in the last  72 hours.  Lab Results  Component Value Date   HGBA1C 5.8 (H) 06/08/2019   ------------------------------------------------------------------------------------------------------------------ No results for input(s): TSH, T4TOTAL, T3FREE, THYROIDAB in the last 72 hours.  Invalid input(s): FREET3  Cardiac Enzymes No results for input(s): CKMB, TROPONINI, MYOGLOBIN in the last 168 hours.  Invalid input(s): CK ------------------------------------------------------------------------------------------------------------------    Component Value Date/Time   BNP 42.1 06/16/2019 0315    Micro Results Recent Results (from the past 240 hour(s))  MRSA PCR Screening     Status: None   Collection Time: 06/09/19  3:50 PM   Specimen: Nasopharyngeal  Result Value Ref Range Status   MRSA by PCR NEGATIVE NEGATIVE Final    Comment:        The GeneXpert MRSA Assay (FDA approved for NASAL specimens only), is one component of a comprehensive MRSA colonization surveillance program. It is not intended to diagnose MRSA infection nor to guide or monitor treatment for MRSA infections. Performed at [facility], [place] [address] [place], [place] [address]   Culture, respiratory (non-expectorated)     Status: None   Collection Time: 06/10/19  5:09 AM   Specimen: Tracheal Aspirate; Respiratory  Result Value Ref Range Status   Specimen Description   Final    TRACHEAL ASPIRATE Performed at [facility] [address], [place], [place] [address]    Special Requests   Final    Normal Performed at [facility], [place] [address], [place], [place] [address]    Gram Stain   Final    FEW WBC PRESENT, PREDOMINANTLY PMN FEW GRAM POSITIVE COCCI IN PAIRS IN  CLUSTERS RARE GRAM NEGATIVE RODS    Culture   Final    FEW STAPHYLOCOCCUS SPECIES (COAGULASE NEGATIVE) CALL MICROBIOLOGY LAB IF SENSITIVITIES ARE REQUIRED. Performed at [facility], [place] [address], [place], [place] [address]    Report Status 06/12/2019 FINAL  Final    Radiology Reports Dg Chest Port 1 View  Result Date: 06/15/2019 CLINICAL DATA:  Respiratory failure. COVID-19 positive. Follow-up right pneumothorax. EXAM: PORTABLE CHEST 1 VIEW COMPARISON:  Earlier today. FINDINGS: The right pleural pigtail catheter has been removed. No pneumothorax is seen. Again demonstrated is a poor inspiration, enlargement of the cardiac silhouette and extensive patchy opacities in both lungs. No pleural fluid seen. Thoracic spine degenerative changes. IMPRESSION: 1. No pneumothorax following right pleural pigtail catheter removal. 2. Stable cardiomegaly and extensive bilateral pneumonia. Electronically Signed   By: Claudie Revering M.D.   On: 06/15/2019 14:17   Dg Chest Port 1 View  Result Date: 06/15/2019 CLINICAL DATA:  COVID-19 EXAM: PORTABLE CHEST 1 VIEW COMPARISON:  Yesterday FINDINGS: Right IJ line has been removed. Stable low volume chest with patchy bilateral airspace disease. Stable chest tube on the right with no visible pneumothorax. IMPRESSION: 1. Stable pneumonia/ARDS. 2. Right chest tube with no visible pneumothorax. Electronically Signed   By: Monte Fantasia M.D.   On: 06/15/2019 07:35   Dg Chest Port 1 View  Result Date: 06/14/2019 CLINICAL DATA:  Acute respiratory failure. EXAM: PORTABLE CHEST 1 VIEW COMPARISON:  06/11/2019 FINDINGS: Interval removal of ET tube. Right-sided chest tube is in place. No pneumothorax. There is a right IJ catheter with tip projecting over the cavoatrial junction. Bilateral interstitial and airspace opacities are again noted, unchanged from previous exam. IMPRESSION: 1. Multifocal bilateral pulmonary opacities are unchanged from previous exam. 2. Stable right  chest tube and right IJ catheter. . Electronically Signed   By: Lovena Le  Clovis Riley M.D.   On: 06/14/2019 11:39   Dg Chest Port 1 View  Result Date: 06/11/2019 CLINICAL DATA:  Chest tube EXAM: PORTABLE CHEST 1 VIEW COMPARISON:  Yesterday FINDINGS: Endotracheal tube tip between the clavicular heads and carina. The orogastric tube tip at least reaches the distal stomach. Right IJ line with tip at the SVC. Right chest tube in stable lateral position. ARDS/viral pneumonia appearance. Stable inflation. No visible pneumothorax. Stable heart size. IMPRESSION: Stable hardware positioning and airspace disease. No visible pneumothorax. Electronically Signed   By: Monte Fantasia M.D.   On: 06/11/2019 08:57   Dg Chest Port 1 View  Result Date: 06/10/2019 CLINICAL DATA:  Endotracheal tube placement. EXAM: PORTABLE CHEST 1 VIEW COMPARISON:  Radiograph of June 09, 2019. FINDINGS: Stable cardiomediastinal silhouette. Stable position of endotracheal and nasogastric tubes. Stable right internal jugular catheter is noted. Stable position of right-sided chest tube. No definite pneumothorax is noted. Stable bilateral lung opacities are noted consistent with multifocal pneumonia. No significant pleural effusion is noted. Bony thorax is unremarkable. IMPRESSION: Stable support apparatus. Stable position of right-sided chest tube without definite pneumothorax. Stable bilateral lung opacities as described above. Electronically Signed   By: Marijo Conception M.D.   On: 06/10/2019 08:56   Dg Chest Port 1 View  Result Date: 06/09/2019 CLINICAL DATA:  Respiratory failure. EXAM: PORTABLE CHEST 1 VIEW COMPARISON:  Radiograph of June 08, 2019. FINDINGS: The heart size and mediastinal contours are within normal limits. Endotracheal and nasogastric tubes are unchanged in position. Right internal jugular catheter is unchanged in position. Right-sided chest tube is unchanged in position. No definite pneumothorax is noted. Subcutaneous  emphysema is seen over right lateral chest wall has resolved. Stable bilateral lung opacities are noted. The visualized skeletal structures are unremarkable. IMPRESSION: Stable support apparatus. Stable position of right-sided chest tube without definite pneumothorax. Stable bilateral lung opacities as described above. Electronically Signed   By: Marijo Conception M.D.   On: 06/09/2019 07:56   Dg Chest Port 1 View  Result Date: 06/08/2019 CLINICAL DATA:  COVID-19 pneumonia, follow-up pneumothorax, respiratory failure EXAM: PORTABLE CHEST 1 VIEW COMPARISON:  Chest radiograph from one day prior. FINDINGS: Endotracheal tube tip is 2.9 cm above the carina. Enteric tube terminates in the distal body of the stomach. Right chest tube terminates in medial right hemithorax. Right internal jugular central venous catheter terminates in the upper third of the SVC. Stable cardiomediastinal silhouette with normal heart size. Large right pneumothorax is significantly increased. No left pneumothorax. No pleural effusion. No significant left mediastinal shift. Severe patchy opacity throughout the left lung is unchanged, with near complete right lung atelectasis. Increased subcutaneous emphysema in the lateral right chest wall. IMPRESSION: 1. Large right pneumothorax, significantly increased despite right chest tube. A subsequent chest radiograph has already been obtained by the time of this dictation. 2. Severe patchy opacity throughout the left lung, compatible with COVID-19 pneumonia. Near complete right lung atelectasis. 3. Well-positioned support structures. Electronically Signed   By: Ilona Sorrel M.D.   On: 06/08/2019 07:59   Dg Chest Port 1 View  Result Date: 06/08/2019 CLINICAL DATA:  COVID-19 pneumonia, pneumothorax EXAM: PORTABLE CHEST 1 VIEW COMPARISON:  Chest radiograph from one day prior. FINDINGS: Right internal jugular central venous catheter terminates in the middle third of the SVC. Endotracheal tube tip is 3.2  cm above the carina. Enteric tube enters stomach with the tip not seen on this image. Right chest tube terminates in the peripheral mid to upper  right pleural space. Stable cardiomediastinal silhouette with normal heart size. Tiny right apical pneumothorax is substantially decreased. No left pneumothorax. No pleural effusion. Severe patchy opacity throughout the periphery of both lungs, not appreciably changed, with improved aeration of the right lung. IMPRESSION: 1. Tiny right apical pneumothorax, substantially decreased, with right chest tube in place. 2. Well-positioned support structures. 3. Stable severe patchy opacity throughout both lungs, compatible with known COVID-19 pneumonia. Electronically Signed   By: Ilona Sorrel M.D.   On: 06/08/2019 07:56   Dg Chest Port 1 View  Result Date: 06/07/2019 CLINICAL DATA:  Respiratory failure due to COVID-19 pneumonia. Central line placement. EXAM: PORTABLE CHEST 1 VIEW COMPARISON:  Prior study at 1021 hours FINDINGS: Endotracheal tube tip now projects at the carina. New right jugular central line with the catheter tip in the lower SVC. Stable small right lateral and apically pneumothorax. Stable positioning of right-sided pigtail catheter extending to midline. Stable severe bilateral airspace pneumonia with peripheral predominance consistent with COVID-19 pneumonia. No pleural fluid. IMPRESSION: Newly placed right jugular central line tip is in the lower SVC. The endotracheal tube projects at the carina on the current chest x-ray. Stable bilateral airspace disease and stable small residual right pneumothorax. Electronically Signed   By: Aletta Edouard M.D.   On: 06/07/2019 14:45   Dg Chest Port 1 View  Addendum Date: 06/07/2019   ADDENDUM REPORT: 06/07/2019 12:25 ADDENDUM: These results were called by telephone at the time of interpretation on 06/07/2019 at 11:15 pm to NP [place] , who verbally acknowledged these results. Electronically Signed   By: Ilona Sorrel M.D.   On: 06/07/2019 12:25   Result Date: 06/07/2019 CLINICAL DATA:  COVID-19 pneumonia.  Chest tube, intubated EXAM: PORTABLE CHEST 1 VIEW COMPARISON:  Chest radiograph from earlier today. FINDINGS: Endotracheal tube tip is at the carina, overlying the right mainstem bronchus. Enteric tube enters stomach with the tip not seen on this image. Stable right pigtail chest tube. Stable cardiomediastinal silhouette with normal heart size. Small right apical pneumothorax is stable. No left pneumothorax. No pleural effusion. Severe patchy consolidation throughout both lungs is stable. IMPRESSION: 1. Endotracheal tube tip at the carina overlying the right mainstem bronchus, recommend 3-4 cm retraction. 2. Well-positioned enteric tube. 3. Stable small right pneumothorax with right chest tube in place. 4. Stable severe patchy consolidation in both lungs compatible with COVID-19 pneumonia. We are attempting to reach the ordering provider now. An addendum will be issued when the results are called to the provider. Electronically Signed: By: Ilona Sorrel M.D. On: 06/07/2019 11:02   Dg Chest Port 1 View  Result Date: 06/07/2019 CLINICAL DATA:  48 year old male with COVID-19 infection with recent right lung collapse. Status post chest tube placement. EXAM: PORTABLE CHEST 1 VIEW COMPARISON:  Chest x-ray 06/07/2019 at 7:32 a.m. FINDINGS: New right-sided small bore pigtail catheter with tip in the medial aspect of the right hemithorax. Interval re-expansion of the right lung with small to moderate residual right pneumothorax. Patchy multifocal interstitial and airspace disease again noted throughout the lungs bilaterally, compatible with multilobar pneumonia. No definite pleural effusions. Heart size is upper limits of normal. Upper mediastinal contours are within normal limits. An endotracheal tube is in place with tip 1.5 cm above the carina. Nasogastric tube is in position with tip in the stomach and side port at the  gastroesophageal junction. IMPRESSION: 1. Support apparatus, as above. Endotracheal tube should be used drawn 3 cm for more optimal placement. Nasogastric tube could  also be advanced another 5 cm for more optimal placement. 2. Small to moderate residual right pneumothorax with re-expanding right lung following chest tube placement. 3. Severe multilobar pneumonia redemonstrated, as above, compatible with COVID-19 infection. Electronically Signed   By: Vinnie Langton M.D.   On: 06/07/2019 09:13   Dg Chest Port 1 View  Result Date: 06/07/2019 CLINICAL DATA:  48 year old male with history of respiratory failure. COVID-19 infection. EXAM: PORTABLE CHEST 1 VIEW COMPARISON:  Chest x-ray 06/05/2019. FINDINGS: Patient is intubated with the tip of the endotracheal tube approximately 4.4 cm above the carina. Nasogastric tube terminates at or immediately below the level of the gastroesophageal junction. New large pneumothorax in the right hemithorax with complete collapse of the right lung. Patchy multifocal interstitial and ill-defined airspace disease throughout the left lung with worsened aeration compared to the prior examination. Heart size is normal. Upper mediastinal contours are within normal limits. IMPRESSION: 1. New large right pneumothorax with complete collapse of the right lung. 2. Support apparatus, as above. Please take note of the high position of the nasogastric tube and consider advancement approximately 10 cm for more optimal placement. 3. Persistent multilobar pneumonia in the left lung with worsening aeration compared to the prior examination. Critical Value/emergent results were called by telephone at the time of interpretation on 06/07/2019 at 8:40 am to [facility] Minor NP, who verbally acknowledged these results. Electronically Signed   By: Vinnie Langton M.D.   On: 06/07/2019 08:48   Dg Chest Port 1 View  Result Date: 06/05/2019 CLINICAL DATA:  Shortness of breath. COVID-19. Hypoxia. EXAM: PORTABLE  CHEST 1 VIEW COMPARISON:  None. FINDINGS: There are extensive bilateral peripheral hazy pulmonary infiltrates. No effusions. The heart size and pulmonary vascularity are normal. No acute bone abnormality. IMPRESSION: Extensive bilateral pulmonary infiltrates. Electronically Signed   By: Lorriane Shire M.D.   On: 06/05/2019 13:36

## 2019-06-16 NOTE — Evaluation (Signed)
Occupational Therapy Evaluation Patient Details Name: Michael Clay MRN: [id number] DOB: 03-13-1971 Today's Date: 06/16/2019    History of Present Illness Pt is a 48 y.o. male PMHx ETOH abuse  -presented to the hospital with severe hypoxia requiring nonrebreather mask, in the ER he was diagnosed with COVID-19 pneumonitis causing acute hypoxic respiratory failure. He developed a right-sided tension pneumothorax requiring a chest tube placement (removed 8/16).  He was in the ICU stabilized extubated (8/14).   Clinical Impression   PTA Pt independent in ADL and mobility. [facility], able to perform standing grooming at sink (quickly) as Pt became slightly light headed (BP 96/66 upon return supine). HR increased to 126. Pt will benefit from skilled OT in the acute setting with next session to focus on establishing HEP and energy conservation (please bring spanish version) to maximize safety and independence in ADL and functional transfers.    Follow Up Recommendations  No OT follow up;Supervision - Intermittent    Equipment Recommendations  3 in 1 bedside commode    Recommendations for Other Services       Precautions / Restrictions Precautions Precaution Comments: watch O2 and orthostatics Restrictions Weight Bearing Restrictions: No      Mobility Bed Mobility Overal bed mobility: Independent                Transfers Overall transfer level: Needs assistance Equipment used: None Transfers: Stand Pivot Transfers   Stand pivot transfers: Supervision            Balance Overall balance assessment: Needs assistance Sitting-balance support: No upper extremity supported;Feet supported Sitting balance-Leahy Scale: Good     Standing balance support: Single extremity supported Standing balance-Leahy Scale: Fair                             ADL either performed or assessed with clinical judgement   ADL Overall ADL's : Needs  assistance/impaired Eating/Feeding: Independent   Grooming: Oral care;Wash/dry face;Wash/dry hands;Min guard;Standing Grooming Details (indicate cue type and reason): very quick, started getting light headed, HR increased to 126 Upper Body Bathing: Min guard;Sitting   Lower Body Bathing: Min guard;Sitting/lateral leans   Upper Body Dressing : Set up;Sitting   Lower Body Dressing: Min guard;Sit to/from stand Lower Body Dressing Details (indicate cue type and reason): able to perform figure 4 for donning socks Toilet Transfer: Supervision/safety;Stand-pivot;BSC   Toileting- Clothing Manipulation and Hygiene: Supervision/safety;Sit to/from stand Toileting - Clothing Manipulation Details (indicate cue type and reason): rear peri care too     Functional mobility during ADLs: Min guard;Rolling walker General ADL Comments: pain from where chest tube was removed.      Vision Patient Visual Report: No change from baseline       Perception     Praxis      Pertinent Vitals/Pain Pain Assessment: Faces Faces Pain Scale: Hurts little more Pain Location: right lower quadrant( did have a chest tube) Pain Descriptors / Indicators: Discomfort;Guarding Pain Intervention(s): Monitored during session;Repositioned     Hand Dominance Right   Extremity/Trunk Assessment Upper Extremity Assessment Upper Extremity Assessment: Overall WFL for tasks assessed   Lower Extremity Assessment Lower Extremity Assessment: Defer to PT evaluation       Communication Communication Communication: Prefers language other than English(stratus interpreter used, iPad in room)   Cognition Arousal/Alertness: Awake/alert Behavior During Therapy: WFL for tasks assessed/performed Overall Cognitive Status: Impaired/Different from baseline Area of Impairment: Attention;Safety/judgement;Awareness  Current Attention Level: Sustained     Safety/Judgement: Decreased awareness of  safety;Decreased awareness of deficits Awareness: Emergent   General Comments: slightly impulsive   General Comments  hard to get a good pleth, on 2L O2 throughout session    Exercises     Shoulder Instructions      Home Living Family/patient expects to be discharged to:: Private residence Living Arrangements: Spouse/significant other Available Help at Discharge: Family Type of Home: House Home Access: Stairs to enter Technical brewer of Steps: 2-4   Home Layout: One level     Bathroom Shower/Tub: Teacher, early years/pre: Standard     Home Equipment: None          Prior Functioning/Environment Level of Independence: Independent                 OT Problem List: Decreased activity tolerance;Impaired balance (sitting and/or standing);Decreased safety awareness;Decreased knowledge of use of DME or AE;Decreased knowledge of precautions;Cardiopulmonary status limiting activity      OT Treatment/Interventions: Self-care/ADL training;Therapeutic exercise;Energy conservation;DME and/or AE instruction;Therapeutic activities;Patient/family education;Balance training    OT Goals(Current goals can be found in the care plan section) Acute Rehab OT Goals Patient Stated Goal: get home ASAP OT Goal Formulation: With patient Time For Goal Achievement: 06/30/19 Potential to Achieve Goals: Good ADL Goals Pt Will Perform Grooming: with modified independence;standing Pt Will Transfer to Toilet: with modified independence;ambulating Pt Will Perform Toileting - Clothing Manipulation and hygiene: with modified independence;sit to/from stand Pt/caregiver will Perform Home Exercise Program: Both right and left upper extremity;With theraband;Independently;With written HEP provided Additional ADL Goal #1: Pt will recall at least 3 ways of conserving energy during ADL at independent level  OT Frequency: Min 2X/week   Barriers to D/C:            Co-evaluation               AM-PAC OT "6 Clicks" Daily Activity     Outcome Measure Help from another person eating meals?: None Help from another person taking care of personal grooming?: A Little Help from another person toileting, which includes using toliet, bedpan, or urinal?: A Little Help from another person bathing (including washing, rinsing, drying)?: A Little Help from another person to put on and taking off regular upper body clothing?: None Help from another person to put on and taking off regular lower body clothing?: A Little 6 Click Score: 20   End of Session Equipment Utilized During Treatment: Oxygen(2L) Nurse Communication: Mobility status  Activity Tolerance: Patient tolerated treatment well Patient left: in bed;with call bell/phone within reach;with nursing/sitter in room  OT Visit Diagnosis: Unsteadiness on feet (R26.81);Other abnormalities of gait and mobility (R26.89)                Time: 3419-6222 OT Time Calculation (min): 24 min Charges:  OT General Charges $OT Visit: 1 Visit OT Evaluation $OT Eval Moderate Complexity: 1 Mod OT Treatments $Self Care/Home Management : 8-22 mins  Hulda Humphrey OTR/L Acute Rehabilitation Services Pager: [phone] Office: [place] 06/16/2019, 6:04 PM

## 2019-06-17 LAB — GLUCOSE, CAPILLARY
Glucose-Capillary: 85 mg/dL (ref 70–99)
Glucose-Capillary: 120 mg/dL — ABNORMAL HIGH (ref 70–99)

## 2019-06-17 MED ORDER — ROBITUSSIN NIGHTTIME COUGH DM 3.125-7.5 MG/5ML PO LIQD: 10.0000 mL | Freq: Three times a day (TID) | ORAL | 0 refills | Status: AC | PRN

## 2019-06-17 MED ORDER — DEXAMETHASONE 4 MG PO TABS
2.0000 mg | ORAL_TABLET | Freq: Every day | ORAL | Status: AC
  Administered 2019-06-17: 2 mg via ORAL
  Filled 2019-06-17: qty 1

## 2019-06-17 MED ORDER — FOLIC ACID 1 MG PO TABS: 1.0000 mg | ORAL_TABLET | Freq: Every day | ORAL | 0 refills | Status: AC

## 2019-06-17 MED ORDER — ALBUTEROL SULFATE HFA 108 (90 BASE) MCG/ACT IN AERS: 2.0000 | INHALATION_SPRAY | Freq: Four times a day (QID) | RESPIRATORY_TRACT | 0 refills | Status: AC | PRN

## 2019-06-17 MED ORDER — VITAMIN B-1 100 MG PO TABS: 100.0000 mg | ORAL_TABLET | Freq: Every day | ORAL | 0 refills | Status: AC

## 2019-06-17 MED ORDER — PREDNISONE 5 MG PO TABS: ORAL_TABLET | ORAL | 0 refills | Status: AC

## 2019-06-17 NOTE — Progress Notes (Signed)
[place] regarding home O2. Pt discharged. Waiting for home o2 to be set up so pt can go home. CN aware

## 2019-06-17 NOTE — Progress Notes (Signed)
SATURATION QUALIFICATIONS: (This note is used to comply with regulatory documentation for home oxygen)  Patient Saturations on Room Air at Rest = 90%  Patient Saturations on Room Air while Ambulating = 87%  Patient Saturations on 2 Liters of oxygen while Ambulating = 92%  Please briefly explain why patient needs home oxygen:covid

## 2019-06-17 NOTE — Discharge Summary (Signed)
Michael BringLeonel Clay ZOX:[id number] DOB: 03-17-1971 DOA: 06/05/2019  PCP: Patient, No Pcp Per  Admit date: 06/05/2019  Discharge date: 06/17/2019  Admitted From: Home   Disposition:  Home   Recommendations for Outpatient Follow-up:   Follow up with PCP in 1-2 weeks  PCP Please obtain BMP/CBC, 2 view CXR in 1week,  (see Discharge instructions)   PCP Please follow up on the following pending results:    Home Health: None   Equipment/Devices: o2  Consultations: None  Discharge Condition: Stable    CODE STATUS: Full    Diet Recommendation: Heart Healthy     Chief Complaint  Patient presents with   Shortness of Breath     Brief history of present illness from the day of admission and additional interim summary     Michael Clay a 48 y.o.Hispanic male PMHxETOH abuse -presented to the hospital with severe hypoxia requiring nonrebreather mask, in the ER he was diagnosed with COVID-19 pneumonitis causing acute hypoxic respiratory failure and he was subsequently admitted to the hospital, debated, he was treated with IV Remdisvir and Solu-Medrol.  Unfortunately he developed a right-sided tension pneumothorax requiring a chest tube placement.  He was in the ICU stabilized extubated and transferred to my care on 06/15/2019.  Day 10 of his hospitalization.                                                                 Hospital Course     1. Acute Hypoxic Resp. Failure due to Acute Covid 19 Viral Pneumonitis during the ongoing 2020 Covid 19 Pandemic - he was treated in the ICU with IV steroids and Remdisvir combination, he did not receive Actemra.  He was intubated.  Care was complicated by development of right-sided acute pneumothorax requiring chest tube placement, chest tube was removed 06/15/2019 without any complications.     He has been started on oral steroid taper for a few days on which she will go home, he is stable on room air but when he ambulates he gets little short of breath and hypoxic hence he will get nasal cannula oxygen 2 L for the times when he ambulates.  I think this will be temporary and will come off soon.  He will be following with his PCP in a week.  He is symptom-free at rest on room air.   2.  History of alcohol abuse.  In the hospital for 10 days, no signs of DTs, placed on folic acid and thiamine, counseled to quit alcohol.  3. CAP at the time of admission.  Has been treated.  4.  Right-sided tension pneumothorax.  See #1 above.  5.  Mild transaminitis.  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  Stable no symptoms repeat CMP in 7  to 10 days post discharge by PCP.   Discharge diagnosis     Principal Problem:   Respiratory tract infection due to COVID-19 virus Active Problems:   COVID-19   Tension pneumothorax, spontaneous   Acute respiratory failure with hypoxia (HCC)   Pneumonia due to COVID-19 virus   Pneumothorax on right   CAP (community acquired pneumonia)   ETOH abuse   Pressure injury of skin    Discharge instructions    Discharge Instructions    Diet - low sodium heart healthy   Complete by: As directed    Discharge instructions   Complete by: As directed    Follow with Primary MD  in 7 days   Get CBC, CMP, 2 view Chest X ray -  checked next visit within 1 week by Primary MD    Activity: As tolerated with Full fall precautions use walker/cane & assistance as needed  Disposition Home    Diet: Heart Healthy    Special Instructions: If you have smoked or chewed Tobacco  in the last 2 yrs please stop smoking, stop any regular Alcohol  and or any Recreational drug use.  On your next visit with your primary care physician please Get Medicines reviewed and adjusted.  Please request your Prim.MD to go  over all Hospital Tests and Procedure/Radiological results at the follow up, please get all Hospital records sent to your Prim MD by signing hospital release before you go home.  If you experience worsening of your admission symptoms, develop shortness of breath, life threatening emergency, suicidal or homicidal thoughts you must seek medical attention immediately by calling 911 or calling your MD immediately  if symptoms less severe.  You Must read complete instructions/literature along with all the possible adverse reactions/side effects for all the Medicines you take and that have been prescribed to you. Take any new Medicines after you have completely understood and accpet all the possible adverse reactions/side effects.   Increase activity slowly   Complete by: As directed    MyChart COVID-19 home monitoring program   Complete by: Jun 17, 2019    Is the patient willing to use the MyChart Mobile App for home monitoring?: Yes   Temperature monitoring   Complete by: Jun 17, 2019    After how many days would you like to receive a notification of this patient's flowsheet entries?: 1      Discharge Medications   Allergies as of 06/17/2019   No Known Allergies     Medication List    STOP taking these medications   ibuprofen 200 MG tablet Commonly known as: ADVIL   PRESCRIPTION MEDICATION     TAKE these medications   albuterol 108 (90 Base) MCG/ACT inhaler Commonly known as: VENTOLIN HFA Inhale 2 puffs into the lungs every 6 (six) hours as needed for wheezing or shortness of breath.   aspirin EC 81 MG tablet Take 162 mg by mouth 2 (two) times daily.   benzonatate 100 MG capsule Commonly known as: TESSALON Take 1 capsule (100 mg total) by mouth every 8 (eight) hours.   folic acid 1 MG tablet Commonly known as: FOLVITE Take 1 tablet (1 mg total) by mouth daily.   predniSONE 5 MG tablet Commonly known as: DELTASONE Label  & dispense according to the schedule below. take 8  Pills PO for 3 days, 6 Pills PO for 3 days, 4 Pills PO for 3 days, 2 Pills PO for 3 days, 1 Pills PO for 3 days, 1/2  Pill  PO for 3 days then STOP. Total 65 pills.   Robitussin Nighttime Cough DM 3.125-7.5 MG/5ML Liqd Generic drug: Doxylamine-DM Take 10 mLs by mouth 3 (three) times daily with meals as needed.   thiamine 100 MG tablet Commonly known as: VITAMIN B-1 Take 1 tablet (100 mg total) by mouth daily.            Durable Medical Equipment  (From admission, onward)         Start     Ordered   06/17/19 0830  For home use only DME oxygen  Once    Question Answer Comment  Length of Need 6 Months   Mode or (Route) Nasal cannula   Liters per Minute 2   Frequency Continuous (stationary and portable oxygen unit needed)   Oxygen conserving device Yes   Oxygen delivery system Gas      06/17/19 0830          Follow-up Information    your PCP. Schedule an appointment as soon as possible for a visit in 1 week(s).        [facility]. Schedule an appointment as soon as possible for a visit in 1 week(s).   Contact information: [address] [place] [address] [phone]          Major procedures and Radiology Reports - PLEASE review detailed and final reports thoroughly  -       Dg Chest Port 1 View  Result Date: 06/15/2019 CLINICAL DATA:  Respiratory failure. COVID-19 positive. Follow-up right pneumothorax. EXAM: PORTABLE CHEST 1 VIEW COMPARISON:  Earlier today. FINDINGS: The right pleural pigtail catheter has been removed. No pneumothorax is seen. Again demonstrated is a poor inspiration, enlargement of the cardiac silhouette and extensive patchy opacities in both lungs. No pleural fluid seen. Thoracic spine degenerative changes. IMPRESSION: 1. No pneumothorax following right pleural pigtail catheter removal. 2. Stable cardiomegaly and extensive bilateral pneumonia. Electronically Signed   By: Beckie Salts M.D.    On: 06/15/2019 14:17   Dg Chest Port 1 View  Result Date: 06/15/2019 CLINICAL DATA:  COVID-19 EXAM: PORTABLE CHEST 1 VIEW COMPARISON:  Yesterday FINDINGS: Right IJ line has been removed. Stable low volume chest with patchy bilateral airspace disease. Stable chest tube on the right with no visible pneumothorax. IMPRESSION: 1. Stable pneumonia/ARDS. 2. Right chest tube with no visible pneumothorax. Electronically Signed   By: Marnee Spring M.D.   On: 06/15/2019 07:35   Dg Chest Port 1 View  Result Date: 06/14/2019 CLINICAL DATA:  Acute respiratory failure. EXAM: PORTABLE CHEST 1 VIEW COMPARISON:  06/11/2019 FINDINGS: Interval removal of ET tube. Right-sided chest tube is in place. No pneumothorax. There is a right IJ catheter with tip projecting over the cavoatrial junction. Bilateral interstitial and airspace opacities are again noted, unchanged from previous exam. IMPRESSION: 1. Multifocal bilateral pulmonary opacities are unchanged from previous exam. 2. Stable right chest tube and right IJ catheter. . Electronically Signed   By: Signa Kell M.D.   On: 06/14/2019 11:39   Dg Chest Port 1 View  Result Date: 06/11/2019 CLINICAL DATA:  Chest tube EXAM: PORTABLE CHEST 1 VIEW COMPARISON:  Yesterday FINDINGS: Endotracheal tube tip between the clavicular heads and carina. The orogastric tube tip at least reaches the distal stomach. Right IJ line with tip at the SVC. Right chest tube in stable lateral position. ARDS/viral pneumonia appearance. Stable inflation. No visible pneumothorax. Stable heart size. IMPRESSION: Stable hardware positioning  and airspace disease. No visible pneumothorax. Electronically Signed   By: Marnee SpringJonathon  Watts M.D.   On: 06/11/2019 08:57   Dg Chest Port 1 View  Result Date: 06/10/2019 CLINICAL DATA:  Endotracheal tube placement. EXAM: PORTABLE CHEST 1 VIEW COMPARISON:  Radiograph of June 09, 2019. FINDINGS: Stable cardiomediastinal silhouette. Stable position of endotracheal and  nasogastric tubes. Stable right internal jugular catheter is noted. Stable position of right-sided chest tube. No definite pneumothorax is noted. Stable bilateral lung opacities are noted consistent with multifocal pneumonia. No significant pleural effusion is noted. Bony thorax is unremarkable. IMPRESSION: Stable support apparatus. Stable position of right-sided chest tube without definite pneumothorax. Stable bilateral lung opacities as described above. Electronically Signed   By: Lupita RaiderJames  Green Jr M.D.   On: 06/10/2019 08:56   Dg Chest Port 1 View  Result Date: 06/09/2019 CLINICAL DATA:  Respiratory failure. EXAM: PORTABLE CHEST 1 VIEW COMPARISON:  Radiograph of June 08, 2019. FINDINGS: The heart size and mediastinal contours are within normal limits. Endotracheal and nasogastric tubes are unchanged in position. Right internal jugular catheter is unchanged in position. Right-sided chest tube is unchanged in position. No definite pneumothorax is noted. Subcutaneous emphysema is seen over right lateral chest wall has resolved. Stable bilateral lung opacities are noted. The visualized skeletal structures are unremarkable. IMPRESSION: Stable support apparatus. Stable position of right-sided chest tube without definite pneumothorax. Stable bilateral lung opacities as described above. Electronically Signed   By: Lupita RaiderJames  Green Jr M.D.   On: 06/09/2019 07:56   Dg Chest Port 1 View  Result Date: 06/08/2019 CLINICAL DATA:  COVID-19 pneumonia, follow-up pneumothorax, respiratory failure EXAM: PORTABLE CHEST 1 VIEW COMPARISON:  Chest radiograph from one day prior. FINDINGS: Endotracheal tube tip is 2.9 cm above the carina. Enteric tube terminates in the distal body of the stomach. Right chest tube terminates in medial right hemithorax. Right internal jugular central venous catheter terminates in the upper third of the SVC. Stable cardiomediastinal silhouette with normal heart size. Large right pneumothorax is  significantly increased. No left pneumothorax. No pleural effusion. No significant left mediastinal shift. Severe patchy opacity throughout the left lung is unchanged, with near complete right lung atelectasis. Increased subcutaneous emphysema in the lateral right chest wall. IMPRESSION: 1. Large right pneumothorax, significantly increased despite right chest tube. A subsequent chest radiograph has already been obtained by the time of this dictation. 2. Severe patchy opacity throughout the left lung, compatible with COVID-19 pneumonia. Near complete right lung atelectasis. 3. Well-positioned support structures. Electronically Signed   By: Delbert PhenixJason A Poff M.D.   On: 06/08/2019 07:59   Dg Chest Port 1 View  Result Date: 06/08/2019 CLINICAL DATA:  COVID-19 pneumonia, pneumothorax EXAM: PORTABLE CHEST 1 VIEW COMPARISON:  Chest radiograph from one day prior. FINDINGS: Right internal jugular central venous catheter terminates in the middle third of the SVC. Endotracheal tube tip is 3.2 cm above the carina. Enteric tube enters stomach with the tip not seen on this image. Right chest tube terminates in the peripheral mid to upper right pleural space. Stable cardiomediastinal silhouette with normal heart size. Tiny right apical pneumothorax is substantially decreased. No left pneumothorax. No pleural effusion. Severe patchy opacity throughout the periphery of both lungs, not appreciably changed, with improved aeration of the right lung. IMPRESSION: 1. Tiny right apical pneumothorax, substantially decreased, with right chest tube in place. 2. Well-positioned support structures. 3. Stable severe patchy opacity throughout both lungs, compatible with known COVID-19 pneumonia. Electronically Signed   By: Delbert PhenixJason A Poff  M.D.   On: 06/08/2019 07:56   Dg Chest Port 1 View  Result Date: 06/07/2019 CLINICAL DATA:  Respiratory failure due to COVID-19 pneumonia. Central line placement. EXAM: PORTABLE CHEST 1 VIEW COMPARISON:  Prior  study at 1021 hours FINDINGS: Endotracheal tube tip now projects at the carina. New right jugular central line with the catheter tip in the lower SVC. Stable small right lateral and apically pneumothorax. Stable positioning of right-sided pigtail catheter extending to midline. Stable severe bilateral airspace pneumonia with peripheral predominance consistent with COVID-19 pneumonia. No pleural fluid. IMPRESSION: Newly placed right jugular central line tip is in the lower SVC. The endotracheal tube projects at the carina on the current chest x-ray. Stable bilateral airspace disease and stable small residual right pneumothorax. Electronically Signed   By: Irish LackGlenn  Yamagata M.D.   On: 06/07/2019 14:45   Dg Chest Port 1 View  Addendum Date: 06/07/2019   ADDENDUM REPORT: 06/07/2019 12:25 ADDENDUM: These results were called by telephone at the time of interpretation on 06/07/2019 at 11:15 pm to [facility] , who verbally acknowledged these results. Electronically Signed   By: Delbert PhenixJason A Poff M.D.   On: 06/07/2019 12:25   Result Date: 06/07/2019 CLINICAL DATA:  COVID-19 pneumonia.  Chest tube, intubated EXAM: PORTABLE CHEST 1 VIEW COMPARISON:  Chest radiograph from earlier today. FINDINGS: Endotracheal tube tip is at the carina, overlying the right mainstem bronchus. Enteric tube enters stomach with the tip not seen on this image. Stable right pigtail chest tube. Stable cardiomediastinal silhouette with normal heart size. Small right apical pneumothorax is stable. No left pneumothorax. No pleural effusion. Severe patchy consolidation throughout both lungs is stable. IMPRESSION: 1. Endotracheal tube tip at the carina overlying the right mainstem bronchus, recommend 3-4 cm retraction. 2. Well-positioned enteric tube. 3. Stable small right pneumothorax with right chest tube in place. 4. Stable severe patchy consolidation in both lungs compatible with COVID-19 pneumonia. We are attempting to reach the ordering provider now.  An addendum will be issued when the results are called to the provider. Electronically Signed: By: Delbert PhenixJason A Poff M.D. On: 06/07/2019 11:02   Dg Chest Port 1 View  Result Date: 06/07/2019 CLINICAL DATA:  48 year old male with COVID-19 infection with recent right lung collapse. Status post chest tube placement. EXAM: PORTABLE CHEST 1 VIEW COMPARISON:  Chest x-ray 06/07/2019 at 7:32 a.m. FINDINGS: New right-sided small bore pigtail catheter with tip in the medial aspect of the right hemithorax. Interval re-expansion of the right lung with small to moderate residual right pneumothorax. Patchy multifocal interstitial and airspace disease again noted throughout the lungs bilaterally, compatible with multilobar pneumonia. No definite pleural effusions. Heart size is upper limits of normal. Upper mediastinal contours are within normal limits. An endotracheal tube is in place with tip 1.5 cm above the carina. Nasogastric tube is in position with tip in the stomach and side port at the gastroesophageal junction. IMPRESSION: 1. Support apparatus, as above. Endotracheal tube should be used drawn 3 cm for more optimal placement. Nasogastric tube could also be advanced another 5 cm for more optimal placement. 2. Small to moderate residual right pneumothorax with re-expanding right lung following chest tube placement. 3. Severe multilobar pneumonia redemonstrated, as above, compatible with COVID-19 infection. Electronically Signed   By: Trudie Reedaniel  Entrikin M.D.   On: 06/07/2019 09:13   Dg Chest Port 1 View  Result Date: 06/07/2019 CLINICAL DATA:  48 year old male with history of respiratory failure. COVID-19 infection. EXAM: PORTABLE CHEST 1 VIEW COMPARISON:  Chest  x-ray 06/05/2019. FINDINGS: Patient is intubated with the tip of the endotracheal tube approximately 4.4 cm above the carina. Nasogastric tube terminates at or immediately below the level of the gastroesophageal junction. New large pneumothorax in the right hemithorax  with complete collapse of the right lung. Patchy multifocal interstitial and ill-defined airspace disease throughout the left lung with worsened aeration compared to the prior examination. Heart size is normal. Upper mediastinal contours are within normal limits. IMPRESSION: 1. New large right pneumothorax with complete collapse of the right lung. 2. Support apparatus, as above. Please take note of the high position of the nasogastric tube and consider advancement approximately 10 cm for more optimal placement. 3. Persistent multilobar pneumonia in the left lung with worsening aeration compared to the prior examination. Critical Value/emergent results were called by telephone at the time of interpretation on 06/07/2019 at 8:40 am to [facility] Minor NP, who verbally acknowledged these results. Electronically Signed   By: Trudie Reed M.D.   On: 06/07/2019 08:48   Dg Chest Port 1 View  Result Date: 06/05/2019 CLINICAL DATA:  Shortness of breath. COVID-19. Hypoxia. EXAM: PORTABLE CHEST 1 VIEW COMPARISON:  None. FINDINGS: There are extensive bilateral peripheral hazy pulmonary infiltrates. No effusions. The heart size and pulmonary vascularity are normal. No acute bone abnormality. IMPRESSION: Extensive bilateral pulmonary infiltrates. Electronically Signed   By: Francene Boyers M.D.   On: 06/05/2019 13:36    Micro Results     Recent Results (from the past 240 hour(s))  MRSA PCR Screening     Status: None   Collection Time: 06/09/19  3:50 PM   Specimen: Nasopharyngeal  Result Value Ref Range Status   MRSA by PCR NEGATIVE NEGATIVE Final    Comment:        The GeneXpert MRSA Assay (FDA approved for NASAL specimens only), is one component of a comprehensive MRSA colonization surveillance program. It is not intended to diagnose MRSA infection nor to guide or monitor treatment for MRSA infections. Performed at [facility], [address] [place], [place] [address]   Culture, respiratory  (non-expectorated)     Status: None   Collection Time: 06/10/19  5:09 AM   Specimen: Tracheal Aspirate; Respiratory  Result Value Ref Range Status   Specimen Description   Final    TRACHEAL ASPIRATE Performed at [facility], [address] [place], [place] [address]    Special Requests   Final    Normal Performed at [facility], [address], [place], [place] [address]    Gram Stain   Final    FEW WBC PRESENT, PREDOMINANTLY PMN FEW GRAM POSITIVE COCCI IN PAIRS IN CLUSTERS RARE GRAM NEGATIVE RODS    Culture   Final    FEW STAPHYLOCOCCUS SPECIES (COAGULASE NEGATIVE) CALL MICROBIOLOGY LAB IF SENSITIVITIES ARE REQUIRED. Performed at [facility], [address], [place], [place] [address]    Report Status 06/12/2019 FINAL  Final    Today   Subjective    Michael Clay today has no headache,no chest abdominal pain,no new weakness tingling or numbness, feels much better wants to go home today.     Objective   Blood pressure 123/78, pulse 64, temperature 98.8 F (37.1 C), resp. rate (!) 24, height 5\' 2"  (1.575 m), weight 65.1 kg, SpO2 96 %.   Intake/Output Summary (Last 24 hours) at 06/17/2019 0927 Last data filed at 06/17/2019 0200 Gross per 24 hour  Intake 240 ml  Output 650 ml  Net -410 ml    Exam Awake Alert,   No new F.N deficits, Normal affect [place].AT,PERRAL Supple Neck,No JVD, No cervical lymphadenopathy appriciated.  Symmetrical Chest wall movement, Good air movement bilaterally, CTAB RRR,No Gallops,Rubs or new Murmurs, No Parasternal Heave +ve B.Sounds, Abd Soft, Non tender, No organomegaly appriciated, No rebound -guarding or rigidity. No Cyanosis, Clubbing or edema, No new Rash or bruise   Data Review   CBC w Diff:  Lab Results  Component Value Date   WBC 6.1 06/16/2019   HGB 13.8 06/16/2019   HCT 42.8 06/16/2019   PLT 273 06/16/2019   LYMPHOPCT 26 06/16/2019   MONOPCT 13 06/16/2019   EOSPCT 4 06/16/2019     BASOPCT 0 06/16/2019    CMP:  Lab Results  Component Value Date   NA 136 06/16/2019   K 3.6 06/16/2019   CL 105 06/16/2019   CO2 23 06/16/2019   BUN 21 (H) 06/16/2019   CREATININE 0.41 (L) 06/16/2019   PROT 6.2 (L) 06/16/2019   ALBUMIN 2.5 (L) 06/16/2019   BILITOT 0.8 06/16/2019   ALKPHOS 80 06/16/2019   AST 92 (H) 06/16/2019   ALT 116 (H) 06/16/2019  .   Total Time in preparing paper work, data evaluation and todays exam - 56 minutes  Lala Lund M.D on 06/17/2019 at 9:27 AM  [facility]   Office  [phone]

## 2019-06-17 NOTE — TOC Transition Note (Addendum)
Transition of Care [facility]) - CM/SW Discharge Note Marvetta Gibbons RN,BSN Transitions of [place] Case Manager [phone]   Patient Details  Name: Michael Clay MRN: [id number] Date of Birth: 1971/06/19  Transition of Care [facility]) CM/SW Contact:  Dawayne Patricia, RN Phone Number: 06/17/2019, 2:32 PM   Clinical Narrative:    Pt admitted with COVID- stable for transition home today, order placed for home 02- call made to bedside RNCorky Sox who assisted in speaking with pt at bedside to confirm home address and phone # in epic for home 02 delivery. Family to come transport pt home after home 02 equipment has been delivered to home. Referral called to [facility] with [place] for home 02 needs- have been given a delivery timeframe of 6pm-10pm this evening- bedside RN aware of timeframe. Call made to [facility] to have [employer] portable tank, thermometer and pulse ox delivered to room for transport home. CM also called [facility] for f/u appointment- which was made for Aug. 26 at 3:10 pm- this will be a telephonic visit with interpreter available.    Final next level of care: Home/Self Care Barriers to Discharge: No Barriers Identified   Patient Goals and CMS Choice Patient states their goals for this hospitalization and ongoing recovery are:: "to get home to family"   Choice offered to / list presented to : NA  Discharge Placement  Home                     Discharge Plan and Services   Discharge Planning Services: CM Consult Post Acute Care Choice: Durable Medical Equipment          DME Arranged: Oxygen DME Agency: [employer] Date DME Agency Contacted: 06/17/19 Time DME Agency Contacted: 1400 Representative spoke with at DME Agency: [place]: NA [place] Agency: NA        Social Determinants of Health ([place]) Interventions     Readmission Risk Interventions Readmission Risk Prevention Plan 06/17/2019  Post Dischage Appt Complete  Medication Screening  Complete  Transportation Screening Complete

## 2019-06-17 NOTE — Discharge Instructions (Signed)
Follow with Primary MD  in 7 days   Get CBC, CMP, 2 view Chest X ray -  checked next visit within 1 week by Primary MD    Activity: As tolerated with Full fall precautions use walker/cane & assistance as needed  Disposition Home    Diet: Heart Healthy    Special Instructions: If you have smoked or chewed Tobacco  in the last 2 yrs please stop smoking, stop any regular Alcohol  and or any Recreational drug use.  On your next visit with your primary care physician please Get Medicines reviewed and adjusted.  Please request your Prim.MD to go over all Hospital Tests and Procedure/Radiological results at the follow up, please get all Hospital records sent to your Prim MD by signing hospital release before you go home.  If you experience worsening of your admission symptoms, develop shortness of breath, life threatening emergency, suicidal or homicidal thoughts you must seek medical attention immediately by calling 911 or calling your MD immediately  if symptoms less severe.  You Must read complete instructions/literature along with all the possible adverse reactions/side effects for all the Medicines you take and that have been prescribed to you. Take any new Medicines after you have completely understood and accpet all the possible adverse reactions/side effects.      Person Under Monitoring Name: Michael Clay  Location: [address] [place] [id number]   CORONAVIRUS DISEASE 2019 (COVID-19) Guidance for Persons Under Investigation You are being tested for the virus that causes coronavirus disease 2019 (COVID-19). Public health actions are necessary to ensure protection of your health and the health of others, and to prevent further spread of infection. COVID-19 is caused by a virus that can cause symptoms, such as fever, cough, and shortness of breath. The primary transmission from person to person is by coughing or sneezing. On November 28, 2018, the World Health Organization  announced a [employer] Emergency of International Concern and on November 29, 2018 the U.S. Department of Health and Human Services declared a public health emergency. If the virus that causesCOVID-19 spreads in the community, it could have severe public health consequences.  As a person under investigation for COVID-19, the [employer] [place] Department of Health and [employer], Division of [employer] advises you to adhere to the following guidance until your test results are reported to you. If your test result is positive, you will receive additional information from your provider and your local health department at that time.   Remain at home until you are cleared by your health provider or public health authorities.   Keep a log of visitors to your home using the form provided. Any visitors to your home must be aware of your isolation status.  If you plan to move to a new address or leave the county, notify the local health department in your county.  Call a doctor or seek care if you have an urgent medical need. Before seeking medical care, call ahead and get instructions from the provider before arriving at the medical office, clinic or hospital. Notify them that you are being tested for the virus that causes COVID-19 so arrangements can be made, as necessary, to prevent transmission to others in the healthcare setting. Next, notify the local health department in your county.  If a medical emergency arises and you need to call 911, inform the first responders that you are being tested for the virus that causes COVID-19. Next, notify the local health department in your  county.  Adhere to all guidance set forth by the [place] for [facility] of patients that is based on guidance from the Center for Disease Control and Prevention with suspected or confirmed COVID-19. It is provided with this guidance for Persons Under Investigation.  Your health and the  health of our community are our top priorities. Public Health officials remain available to provide assistance and counseling to you about COVID-19 and compliance with this guidance.  Provider: ____________________________________________________________ Date: ______/_____/_________  By signing below, you acknowledge that you have read and agree to comply with this Guidance for Persons Under Investigation. ______________________________________________________________ Date: ______/_____/_________  WHO DO I CALL? You can find a list of local health departments here: [email or web address] Health Department: ____________________________________________________________________ Contact Name: ________________________________________________________________________ Telephone: ___________________________________________________________________________  Marice Potter, [place], Communicable Disease Branch COVID-19 Guidance for Persons Under Investigation January 04, 2019

## 2019-06-18 LAB — GLUCOSE, CAPILLARY: Glucose-Capillary: 125 mg/dL — ABNORMAL HIGH (ref 70–99)

## 2019-06-23 NOTE — Progress Notes (Signed)
Patient ID: Michael Clay, male   DOB: 1971-03-31, 48 y.o.   MRN: [id number]  Virtual Visit via Telephone Note  I connected with Jeral Pinch on 06/25/19 at  3:10 PM EDT by telephone and verified that I am speaking with the correct person using two identifiers.   I discussed the limitations, risks, security and privacy concerns of performing an evaluation and management service by telephone and the availability of in person appointments. I also discussed with the patient that there may be a patient responsible charge related to this service. The patient expressed understanding and agreed to proceed.  Patient location:  home My Location:  [facility] office Persons on the call: me, interpreter, and the patient   History of Present Illness: After hospitalization for Covid 19 from 8/6-8/18/2020.  No fever-temps running at 98 degrees.  No N/V/D.  No SOB.  Mild pain on R side of chest but much improved.  Cough is much improved. Got prescriptions filled.   From discharge summary:  Principal Problem:   Respiratory tract infection due to COVID-19 virus Active Problems:   COVID-19   Tension pneumothorax, spontaneous   Acute respiratory failure with hypoxia (HCC)   Pneumonia due to COVID-19 virus   Pneumothorax on right   CAP (community acquired pneumonia)   ETOH abuse   Pressure injury of skin  Grant Lucasis a 48 y.o.Hispanic male PMHxETOH abuse -presented to the hospital with severe hypoxia requiring nonrebreather mask, in the ER he was diagnosed with COVID-19 pneumonitis causing acute hypoxic respiratory failure and he was subsequently admitted to the hospital, debated, he was treated with IV Remdisvir and Solu-Medrol. Unfortunately he developed a right-sided tension pneumothorax requiring a chest tube placement. He was in the ICU stabilized extubated and transferred to my care on 06/15/2019. Day 10 of his hospitalization.  1. Acute Hypoxic Resp. Failure due to Acute Covid 19 Viral  Pneumonitis during the ongoing 2020 Covid 19 Pandemic - he was treated in the ICU with IV steroids and Remdisvir combination, he did not receive Actemra. He was intubated. Care was complicated by development of right-sided acute pneumothorax requiring chest tube placement, chest tube was removed 06/15/2019 without any complications.   He has been started on oral steroid taper for a few days on which she will go home, he is stable on room air but when he ambulates he gets little short of breath and hypoxic hence he will get nasal cannula oxygen 2 L for the times when he ambulates.  I think this will be temporary and will come off soon.  He will be following with his PCP in a week.  He is symptom-free at rest on room air.   2. History of alcohol abuse. In the hospital for 10 days, no signs of DTs, placed on folic acid and thiamine, counseled to quit alcohol.  3. CAP at the time of admission. Has been treated.  4. Right-sided tension pneumothorax. See #1 above.  5. Mild transaminitis.Likely some underlying alcoholic liver injury, also could have COVID-19 related mild liver injury. Stable no symptoms repeat CMP in 7 to 10 days post discharge by PCP.    Observations/Objective:  A&Ox3   Assessment and Plan: 1. Acute respiratory failure with hypoxia (HCC) Much improved-  2. Pneumonia due to COVID-19 virus Resolving-much improved-complete 2 week quarantine  3. Tension pneumothorax, spontaneous resolved  4. Hospital discharge follow-up Much improvement  5. ETOH abuse I have counseled the patient at length about substance abuse and addiction.  12 step meetings/recovery  recommended.  Local 12 step meeting lists were given and attendance was encouraged.  Patient expresses understanding.    Follow Up Instructions: Assign PCP in 1 month    I discussed the assessment and treatment plan with the patient. The patient was provided an opportunity to ask questions and all were  answered. The patient agreed with the plan and demonstrated an understanding of the instructions.   The patient was advised to call back or seek an in-person evaluation if the symptoms worsen or if the condition fails to improve as anticipated.  I provided 18 minutes of non-face-to-face time during this encounter.   Georgian CoAngela McClung, PA-C

## 2019-06-25 ENCOUNTER — Ambulatory Visit: Payer: HRSA Program | Attending: Family Medicine | Admitting: Physician Assistant

## 2019-06-25 ENCOUNTER — Other Ambulatory Visit: Payer: Self-pay

## 2019-06-25 DIAGNOSIS — U071 COVID-19: Secondary | ICD-10-CM | POA: Diagnosis not present

## 2019-06-25 DIAGNOSIS — J9601 Acute respiratory failure with hypoxia: Secondary | ICD-10-CM | POA: Diagnosis not present

## 2019-06-25 DIAGNOSIS — F1010 Alcohol abuse, uncomplicated: Secondary | ICD-10-CM

## 2019-06-25 DIAGNOSIS — J1289 Other viral pneumonia: Secondary | ICD-10-CM

## 2019-06-25 DIAGNOSIS — J930 Spontaneous tension pneumothorax: Secondary | ICD-10-CM | POA: Diagnosis not present

## 2019-06-25 DIAGNOSIS — J1282 Pneumonia due to coronavirus disease 2019: Secondary | ICD-10-CM

## 2019-06-25 DIAGNOSIS — Z09 Encounter for follow-up examination after completed treatment for conditions other than malignant neoplasm: Secondary | ICD-10-CM

## 2019-06-25 NOTE — Progress Notes (Signed)
Patient verified DOB Patient has eaten today. Patient has take medication today. Patient complains of pain in the right side of the rib cage.  Patient denies fevers in home, no nausea or vomiting

## 2019-07-30 ENCOUNTER — Other Ambulatory Visit: Payer: Self-pay

## 2019-07-30 ENCOUNTER — Encounter: Payer: Self-pay | Admitting: Family Medicine

## 2019-07-30 ENCOUNTER — Ambulatory Visit: Payer: HRSA Program | Attending: Family Medicine | Admitting: Family Medicine

## 2019-07-30 VITALS — BP 137/82 | HR 63 | Temp 98.3°F | Resp 16 | Wt 166.0 lb

## 2019-07-30 DIAGNOSIS — Z758 Other problems related to medical facilities and other health care: Secondary | ICD-10-CM

## 2019-07-30 DIAGNOSIS — M79621 Pain in right upper arm: Secondary | ICD-10-CM

## 2019-07-30 DIAGNOSIS — Z603 Acculturation difficulty: Secondary | ICD-10-CM

## 2019-07-30 DIAGNOSIS — Z9889 Other specified postprocedural states: Secondary | ICD-10-CM

## 2019-07-30 DIAGNOSIS — Z8709 Personal history of other diseases of the respiratory system: Secondary | ICD-10-CM

## 2019-07-30 DIAGNOSIS — Z8616 Personal history of COVID-19: Secondary | ICD-10-CM

## 2019-07-30 DIAGNOSIS — R7401 Elevation of levels of liver transaminase levels: Secondary | ICD-10-CM

## 2019-07-30 DIAGNOSIS — U071 COVID-19: Secondary | ICD-10-CM | POA: Diagnosis not present

## 2019-07-30 DIAGNOSIS — Z789 Other specified health status: Secondary | ICD-10-CM

## 2019-07-30 DIAGNOSIS — Z8619 Personal history of other infectious and parasitic diseases: Secondary | ICD-10-CM

## 2019-07-30 NOTE — Progress Notes (Signed)
Follow up Needs refills on medication  Has some discomfort on the right side of lung affected by COVID in July.    Right arm pain 8/10 intermittent Noticed after he got out of the [place]

## 2019-07-30 NOTE — Progress Notes (Signed)
Established Patient Office Visit  Subjective:  Patient ID: Michael Clay, male    DOB: 01-Sep-1971  Age: 48 y.o. MRN: [id number]  CC:  Chief Complaint  Patient presents with   Follow-up   Due to language barrier, Stratus video interpretation system used at today's visit.  HPI Early Steel, 48 yo male who is status post hospitalization from 06/05/2019-06/17/2019 due to acute respiratory failure due to COVID-19 pneumonitis which was treated with a non-rebreather mask, IV steroids and Remdisivir combination, he also had an acute right sided pneumothorax requiring chest tube placement and chest tube was removed on 06/15/2019.  Patient reports that during his hospitalization he had an injection or IV medication in his right upper arm and since that time he has had pain and numbness in his right upper arm. Pain can range from a 6 to a 9 on a 0-to-10 scale with 0 being no pain and 10 being the worst possible pain.  He has increased pain when attempting to lift/move his arm and also has pain if he sleeps on his right arm.  He denies any numbness or tingling in the fingertips.  He denies any pain or injury to the arm prior to his hospitalization.       He reports no current shortness of breath or cough related to his prior COVID-19 lung infection.  He has some mild fatigue.  He also had some elevation in liver enzymes during his hospitalization but he denies any issues with abdominal pain, no nausea/vomiting/diarrhea or constipation.  No change in the color of stools.  Patient did drink 3-4 beers per night after work prior to his hospitalization but states that he no longer drinks any beer status post hospital discharge.  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  He has only occasional minimal discomfort at the site of right chest tube placement.  He has had no recent fever or chills, no chest pain or palpitations.  No urinary frequency, urgency or dysuria.  He was discharged home with  oxygen but states that after about a week he no longer needed the oxygen and that oxygen and equipment have been picked up already by home health.   Past Medical History:  Diagnosis Date   History of 2019 novel coronavirus disease (COVID-19)    History of chest tube placement    History of pneumothorax     Past Surgical History:  Procedure Laterality Date   tendon repair left pointer finger      Family History  Problem Relation Age of Onset   Healthy Mother    Healthy Father    Diabetes Neg Hx    Hypertension Neg Hx    Heart disease Neg Hx    CAD Neg Hx     Social History   Socioeconomic History   Marital status: Single    Spouse name: Not on file   Number of children: Not on file   Years of education: Not on file   Highest education level: Not on file  Occupational History   Occupation: roofing  Social Needs   Financial resource strain: Not on file   Food insecurity    Worry: Not on file    Inability: Not on file   Transportation needs    Medical: Not on file    Non-medical: Not on file  Tobacco Use   Smoking status: Current Some Day Smoker    Types: Cigarettes   Smokeless tobacco: Never Used  Substance and Sexual Activity   Alcohol use: Yes    Comment: drinks beer regularly   Drug use: Never   Sexual activity: Not on file  Lifestyle   Physical activity    Days per week: Not on file    Minutes per session: Not on file   Stress: Not on file  Relationships   Social connections    Talks on phone: Not on file    Gets together: Not on file    Attends religious service: Not on file    Active member of club or organization: Not on file    Attends meetings of clubs or organizations: Not on file    Relationship status: Not on file   Intimate partner violence    Fear of current or ex partner: Not on file    Emotionally abused: Not on file    Physically abused: Not on file    Forced sexual activity: Not on file  Other Topics  Concern   Not on file  Social History Narrative   Not on file    Outpatient Medications Prior to Visit  Medication Sig Dispense Refill   benzonatate (TESSALON) 100 MG capsule Take 1 capsule (100 mg total) by mouth every 8 (eight) hours. 21 capsule 0   Doxylamine-DM (ROBITUSSIN NIGHTTIME COUGH DM) 3.125-7.5 MG/5ML LIQD Take 10 mLs by mouth 3 (three) times daily with meals as needed. 118 mL 0   thiamine (VITAMIN B-1) 100 MG tablet Take 1 tablet (100 mg total) by mouth daily. 30 tablet 0   albuterol (VENTOLIN HFA) 108 (90 Base) MCG/ACT inhaler Inhale 2 puffs into the lungs every 6 (six) hours as needed for wheezing or shortness of breath. (Patient not taking: Reported on 07/30/2019) 6.7 g 0   aspirin EC 81 MG tablet Take 162 mg by mouth 2 (two) times daily.     folic acid (FOLVITE) 1 MG tablet Take 1 tablet (1 mg total) by mouth daily. (Patient not taking: Reported on 07/30/2019) 30 tablet 0   predniSONE (DELTASONE) 5 MG tablet Label  & dispense according to the schedule below. take 8 Pills PO for 3 days, 6 Pills PO for 3 days, 4 Pills PO for 3 days, 2 Pills PO for 3 days, 1 Pills PO for 3 days, 1/2 Pill  PO for 3 days then STOP. Total 65 pills. (Patient not taking: Reported on 07/30/2019) 65 tablet 0   No facility-administered medications prior to visit.     No Known Allergies  ROS Review of Systems  Constitutional: Positive for fatigue (Mild). Negative for chills and fever.  HENT: Negative for congestion, sore throat and trouble swallowing.   Eyes: Negative for photophobia and visual disturbance.  Respiratory: Negative for cough and shortness of breath.   Gastrointestinal: Negative for abdominal pain, blood in stool, constipation, diarrhea and nausea.  Endocrine: Negative for cold intolerance, heat intolerance, polydipsia, polyphagia and polyuria.  Genitourinary: Negative for dysuria and frequency.  Musculoskeletal: Positive for arthralgias. Negative for back pain.  Neurological:  Negative for dizziness and headaches.  Hematological: Negative for adenopathy. Does not bruise/bleed easily.      Objective:    Physical Exam  Constitutional: He is oriented to person, place, and time. He appears well-developed and well-nourished. No distress.  Well-nourished well-developed overweight for height male in no acute distress  Neck: Normal range of motion. Neck supple. No JVD present. No thyromegaly present.  Cardiovascular: Normal rate and regular rhythm.  Pulmonary/Chest: Effort normal and breath sounds normal.  Abdominal: Soft. There is no abdominal tenderness. There is no rebound and no guarding.  Genitourinary:    Genitourinary Comments: No CVA tenderness   Musculoskeletal:        General: Tenderness and edema present.     Comments: Patient with some tenderness to palpation at the right shoulder AC joint.  He complains of some discomfort with overhead reaching/motions such as attempting to comb the hair.  Negative empty can/impingement sign at the right shoulder  Lymphadenopathy:    He has no cervical adenopathy.  Neurological: He is alert and oriented to person, place, and time.  Skin: Skin is warm and dry.  Patient with healed scar along the right lateral chest wall an area of prior chest tube.  Mild tenderness with palpation of the area  Psychiatric: He has a normal mood and affect. His behavior is normal.  Nursing note and vitals reviewed.   BP 137/82 (BP Location: Right Arm, Patient Position: Sitting, Cuff Size: Normal)    Pulse 63    Temp 98.3 F (36.8 C) (Oral)    Resp 16    Wt 166 lb (75.3 kg)    SpO2 98%    BMI 30.36 kg/m  Wt Readings from Last 3 Encounters:  07/30/19 166 lb (75.3 kg)  06/17/19 143 lb 9.6 oz (65.1 kg)     Health Maintenance Due  Topic Date Due   TETANUS/TDAP  02/08/1989     No results found for: TSH Lab Results  Component Value Date   WBC 6.1 06/16/2019   HGB 13.8 06/16/2019   HCT 42.8 06/16/2019   MCV 99.3 06/16/2019   PLT  273 06/16/2019   Lab Results  Component Value Date   NA 142 07/30/2019   K 4.2 07/30/2019   CO2 22 07/30/2019   GLUCOSE 94 07/30/2019   BUN 8 07/30/2019   CREATININE 0.58 (L) 07/30/2019   BILITOT 0.5 07/30/2019   ALKPHOS 105 07/30/2019   AST 35 07/30/2019   ALT 57 (H) 07/30/2019   PROT 6.7 07/30/2019   ALBUMIN 4.3 07/30/2019   CALCIUM 9.1 07/30/2019   ANIONGAP 8 06/16/2019   No results found for: CHOL No results found for: HDL No results found for: [facility] Lab Results  Component Value Date   TRIG 144 06/13/2019   No results found for: [facility] Lab Results  Component Value Date   HGBA1C 5.8 (H) 06/08/2019      Assessment & Plan:  1. Pain in right upper arm Patient with complaint of moderate to severe pain in the right upper arm/shoulder area which he believes is associated with receiving an injection or IV during his hospitalization.  On exam, patient has findings suggestive of possible AC joint arthritis and possible mild tendinitis.  He has been referred to orthopedics for further evaluation and treatment of his arm pain - Ambulatory referral to Orthopedic Surgery  2. History of 2019 novel coronavirus disease (COVID-19) He is status post hospitalization from 06/05/2019 through 06/17/2019 at [facility] due to respiratory tract infection from COVID-19.  Patient's hospitalization was complicated by the need for intubation and development of a right-sided tension pneumothorax which required chest tube placement.  Patient was able to be discharged to home on 1 L of oxygen at rest and 2 L of oxygen with activity but states that after about 1 week he no longer needed the oxygen.  Patient has been asked to get a chest x-ray and follow-up of his history of acute respiratory failure and pneumothorax.  Patient will have CMP as he did develop transaminitis/elevated liver enzymes during his hospitalization.  He was also treated with steroids during hospitalization as well as  discharged on prednisone taper which can cause elevations in blood sugar/steroid-induced diabetes. - Comprehensive metabolic panel - DG Chest 2 View; Future  3. Transaminitis Patient with elevated liver enzymes during hospitalization for COVID-19 infection/illness and patient had AST of 92 and ALT of 116 on 06/16/2019.  He will have a repeat comprehensive metabolic panel to recheck liver enzymes and to see if any other therapy is warranted.  He does have history of daily alcohol use/drinking beer daily after work per his report but he states that he is now stopped drinking since his hospitalization. - Comprehensive metabolic panel  4. History of chest tube placement; 5.  History of pneumothorax  patient has been asked to obtain chest x-ray secondary to his history of pneumothorax and chest tube placement during his hospitalization for COVID-19 pneumonitis with acute respiratory failure - DG Chest 2 View; Future  6. Language barrier Stratus video interpretation system used at today's visit to help with language barrier  -Need for influenza immunization Received influenza immunization as nurse visit while here in the office today  An After Visit Summary was printed and given to the patient.  Follow-up: Return for follow-up as needed and you will be contacted about labs/CXR and if f/u needed.   Antony Blackbird, MD

## 2019-07-31 LAB — COMPREHENSIVE METABOLIC PANEL WITH GFR
ALT: 57 IU/L — ABNORMAL HIGH (ref 0–44)
AST: 35 IU/L (ref 0–40)
Albumin/Globulin Ratio: 1.8 (ref 1.2–2.2)
Albumin: 4.3 g/dL (ref 4.0–5.0)
Alkaline Phosphatase: 105 IU/L (ref 39–117)
BUN/Creatinine Ratio: 14 (ref 9–20)
BUN: 8 mg/dL (ref 6–24)
Bilirubin Total: 0.5 mg/dL (ref 0.0–1.2)
CO2: 22 mmol/L (ref 20–29)
Calcium: 9.1 mg/dL (ref 8.7–10.2)
Chloride: 105 mmol/L (ref 96–106)
Creatinine, Ser: 0.58 mg/dL — ABNORMAL LOW (ref 0.76–1.27)
GFR calc Af Amer: 138 mL/min/1.73
GFR calc non Af Amer: 120 mL/min/1.73
Globulin, Total: 2.4 g/dL (ref 1.5–4.5)
Glucose: 94 mg/dL (ref 65–99)
Potassium: 4.2 mmol/L (ref 3.5–5.2)
Sodium: 142 mmol/L (ref 134–144)
Total Protein: 6.7 g/dL (ref 6.0–8.5)

## 2019-08-01 ENCOUNTER — Telehealth (HOSPITAL_COMMUNITY): Payer: Self-pay | Admitting: Emergency Medicine

## 2019-08-01 NOTE — Telephone Encounter (Signed)
Pt called and left VM, called back with interpreter and left VM.

## 2019-08-04 ENCOUNTER — Other Ambulatory Visit: Payer: Self-pay

## 2019-08-04 ENCOUNTER — Ambulatory Visit (HOSPITAL_COMMUNITY)
Admission: RE | Admit: 2019-08-04 | Discharge: 2019-08-04 | Disposition: A | Discharge status: Home / Self Care | Payer: Self-pay | Source: Ambulatory Visit | Attending: Family Medicine | Admitting: Family Medicine

## 2019-08-04 DIAGNOSIS — Z9889 Other specified postprocedural states: Secondary | ICD-10-CM | POA: Insufficient documentation

## 2019-08-04 DIAGNOSIS — Z8619 Personal history of other infectious and parasitic diseases: Secondary | ICD-10-CM | POA: Insufficient documentation

## 2019-08-04 DIAGNOSIS — Z8709 Personal history of other diseases of the respiratory system: Secondary | ICD-10-CM | POA: Insufficient documentation

## 2019-08-04 DIAGNOSIS — Z8616 Personal history of COVID-19: Secondary | ICD-10-CM

## 2020-02-08 IMAGING — DX DG CHEST 2V
2 series · 2 of 2 positions shown · non-contrast
Comparison: Chest radiograph 06/15/2019, 06/14/2019

CLINICAL DATA: S/p COVID pneumonia and R pneumothorax requiring
chest tube in 3535. Pt denies fever or cough at this time. Feels
better.

EXAM:
CHEST - 2 VIEW

[chest pa]
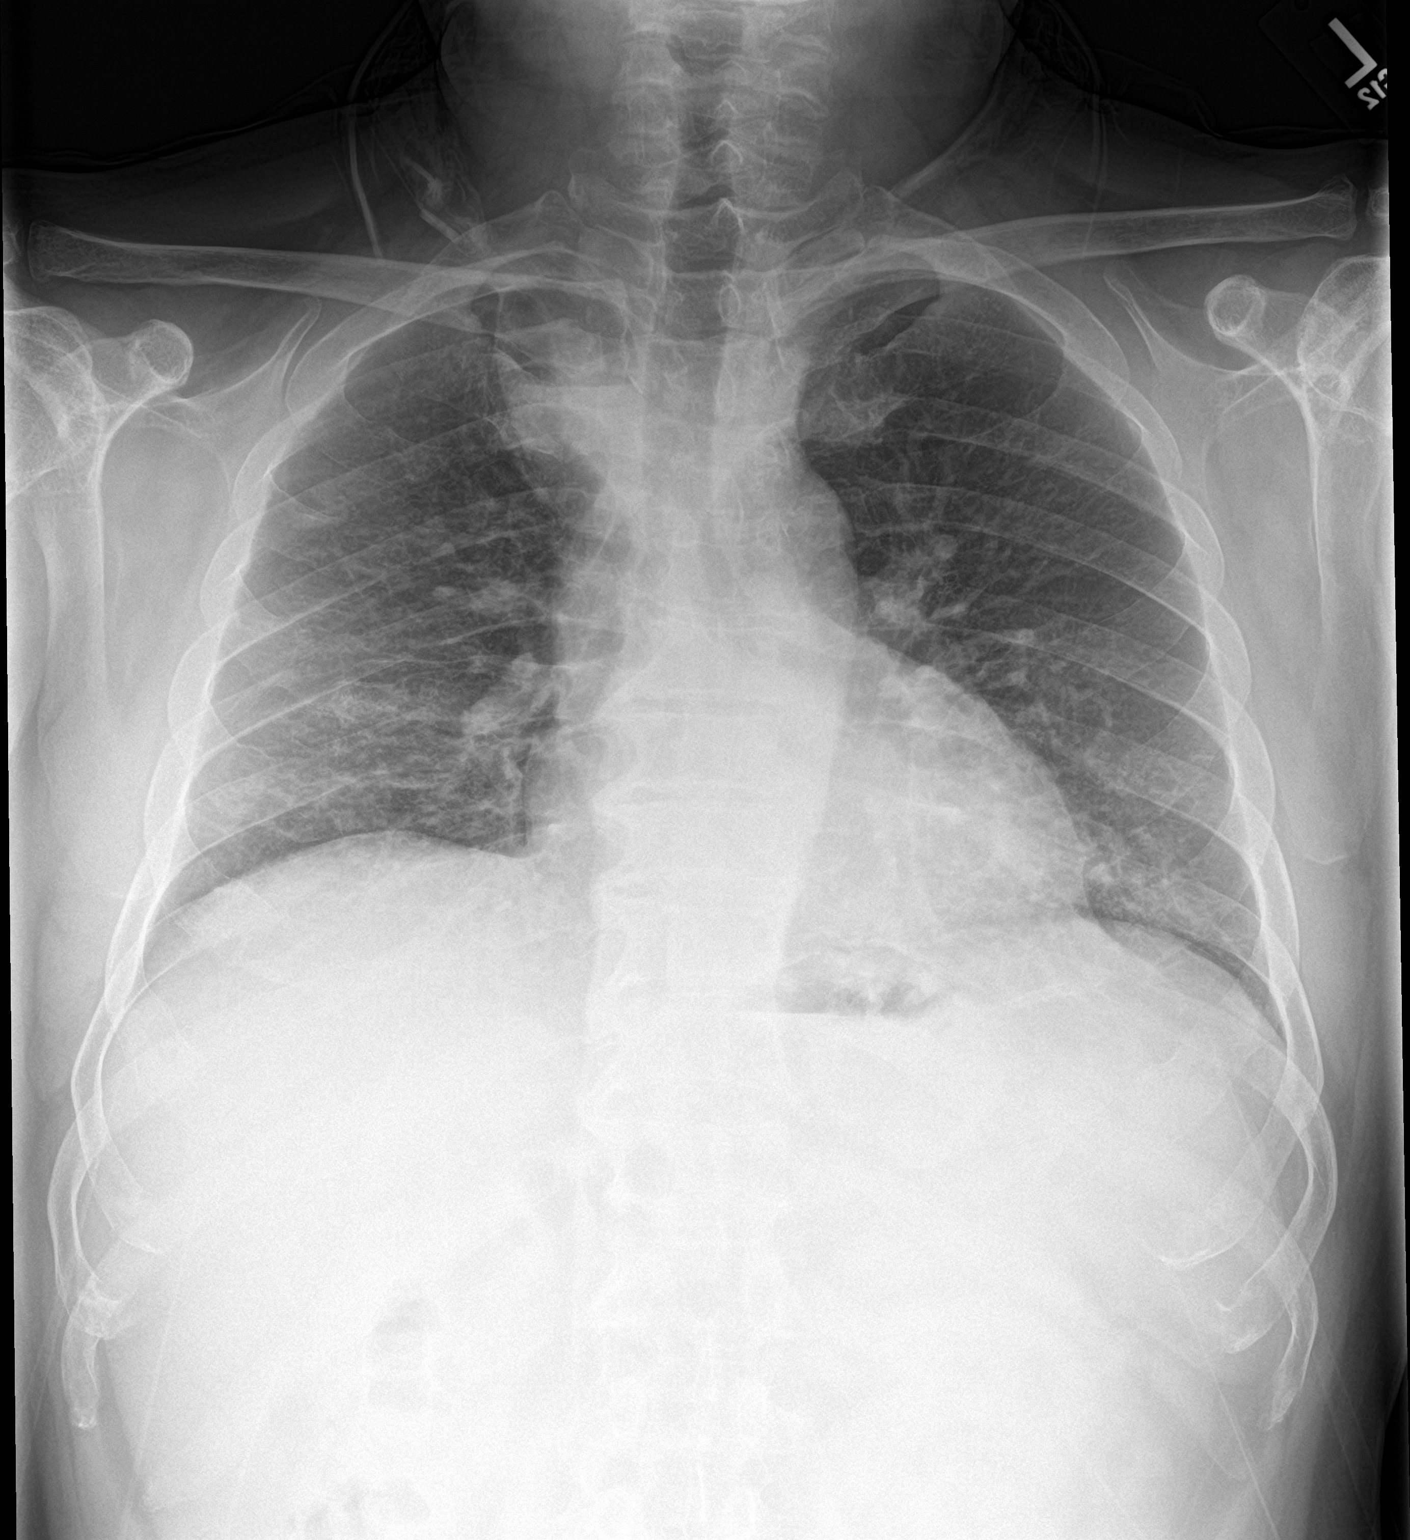

[chest lat]
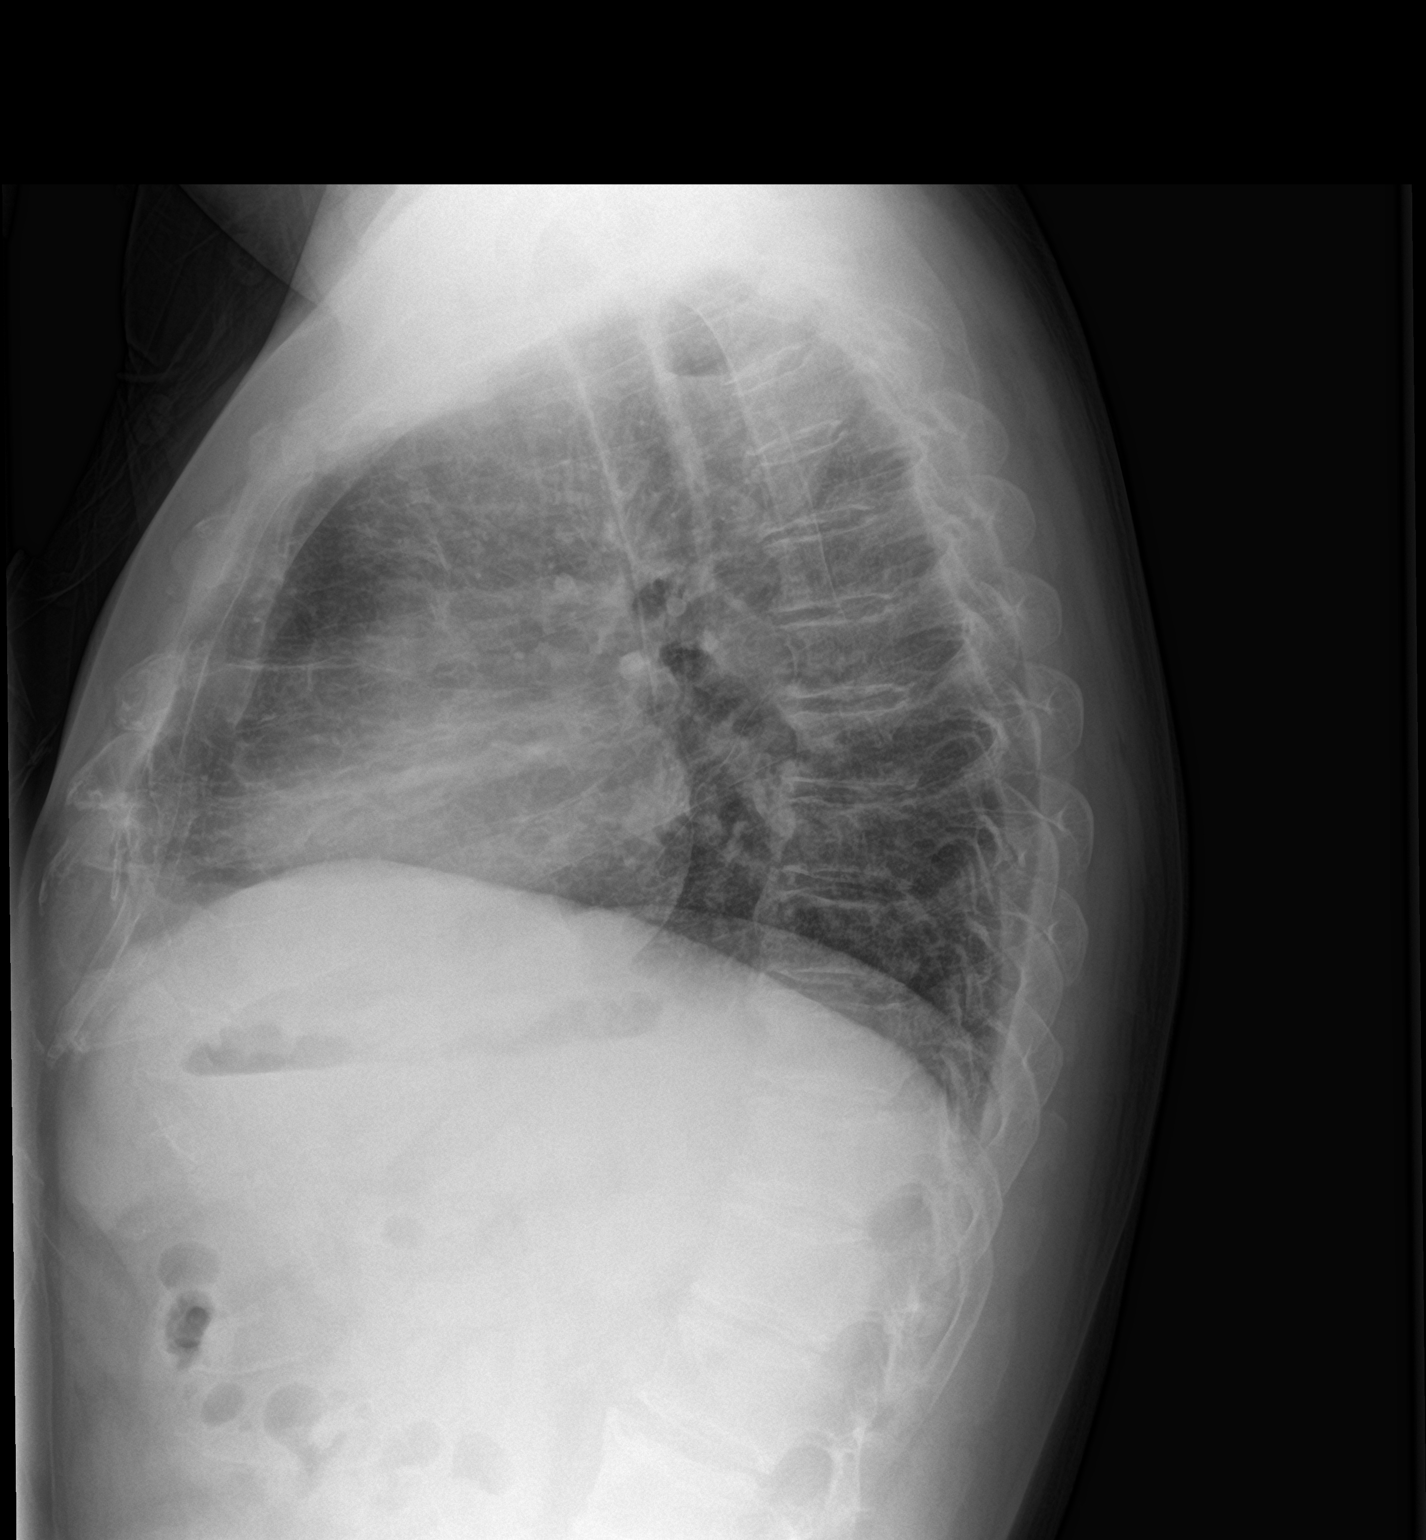

[2 of 2 positions shown; findings below may reference images not displayed]

FINDINGS: Stable cardiomediastinal contours. Interval significant decrease in
the previously seen bilateral pulmonary opacities. Low lung volumes.
There remain some mild reticular opacities in the bilateral lower
lobes which could reflect atelectasis or scarring. No pneumothorax
or large pleural effusion. No acute finding in the visualized
skeleton.
IMPRESSION: 1. Marked interval improvement in the previously seen bilateral
pulmonary opacities.
2. Mild residual reticular opacities in the bilateral lower lobes
could reflect atelectasis or scarring.
